# Patient Record
Sex: Female | Born: 1935 | Race: White | Hispanic: No | Smoking: Never smoker
Health system: Southern US, Community
[De-identification: ages and names within clinical notes are randomized; demographics above are authoritative.]

## PROBLEM LIST (undated history)

## (undated) DIAGNOSIS — H9110 Presbycusis, unspecified ear: Secondary | ICD-10-CM

## (undated) DIAGNOSIS — M159 Polyosteoarthritis, unspecified: Secondary | ICD-10-CM

## (undated) DIAGNOSIS — N6019 Diffuse cystic mastopathy of unspecified breast: Secondary | ICD-10-CM

## (undated) DIAGNOSIS — K635 Polyp of colon: Secondary | ICD-10-CM

## (undated) DIAGNOSIS — M8580 Other specified disorders of bone density and structure, unspecified site: Secondary | ICD-10-CM

## (undated) DIAGNOSIS — I493 Ventricular premature depolarization: Secondary | ICD-10-CM

## (undated) DIAGNOSIS — I4892 Unspecified atrial flutter: Principal | ICD-10-CM

## (undated) DIAGNOSIS — I4819 Other persistent atrial fibrillation: Secondary | ICD-10-CM

## (undated) DIAGNOSIS — K5790 Diverticulosis of intestine, part unspecified, without perforation or abscess without bleeding: Secondary | ICD-10-CM

## (undated) DIAGNOSIS — H35372 Puckering of macula, left eye: Secondary | ICD-10-CM

## (undated) DIAGNOSIS — H903 Sensorineural hearing loss, bilateral: Secondary | ICD-10-CM

## (undated) HISTORY — PX: OTHER SURGICAL HISTORY: SHX169

## (undated) HISTORY — DX: Unspecified atrial flutter: I48.92

## (undated) HISTORY — DX: Other persistent atrial fibrillation: I48.19

## (undated) HISTORY — PX: ABDOMINAL HYSTERECTOMY: SHX81

## (undated) HISTORY — PX: APPENDECTOMY: SHX54

## (undated) HISTORY — DX: Diverticulosis of intestine, part unspecified, without perforation or abscess without bleeding: K57.90

## (undated) HISTORY — PX: ROTATOR CUFF REPAIR: SHX139

## (undated) HISTORY — PX: CATARACT EXTRACTION, BILATERAL: SHX1313

## (undated) HISTORY — DX: Polyosteoarthritis, unspecified: M15.9

## (undated) HISTORY — DX: Other specified disorders of bone density and structure, unspecified site: M85.80

## (undated) HISTORY — PX: TONSILLECTOMY: SUR1361

## (undated) HISTORY — DX: Presbycusis, unspecified ear: H91.10

## (undated) HISTORY — DX: Diffuse cystic mastopathy of unspecified breast: N60.19

## (undated) HISTORY — DX: Polyp of colon: K63.5

## (undated) HISTORY — DX: Sensorineural hearing loss, bilateral: H90.3

## (undated) HISTORY — DX: Ventricular premature depolarization: I49.3

## (undated) HISTORY — DX: Puckering of macula, left eye: H35.372

---

## 1997-12-20 ENCOUNTER — Ambulatory Visit (HOSPITAL_COMMUNITY): Admission: RE | Admit: 1997-12-20 | Discharge: 1997-12-20 | Payer: Self-pay | Admitting: Internal Medicine

## 1999-01-01 ENCOUNTER — Encounter: Payer: Self-pay | Admitting: Internal Medicine

## 1999-01-01 ENCOUNTER — Ambulatory Visit (HOSPITAL_COMMUNITY): Admission: RE | Admit: 1999-01-01 | Discharge: 1999-01-01 | Payer: Self-pay | Admitting: Internal Medicine

## 1999-05-28 ENCOUNTER — Ambulatory Visit (HOSPITAL_COMMUNITY): Admission: RE | Admit: 1999-05-28 | Discharge: 1999-05-28 | Payer: Self-pay | Admitting: Sports Medicine

## 1999-05-28 ENCOUNTER — Encounter: Payer: Self-pay | Admitting: Sports Medicine

## 1999-06-01 ENCOUNTER — Encounter: Payer: Self-pay | Admitting: Sports Medicine

## 1999-06-01 ENCOUNTER — Ambulatory Visit (HOSPITAL_COMMUNITY): Admission: RE | Admit: 1999-06-01 | Discharge: 1999-06-01 | Payer: Self-pay | Admitting: Sports Medicine

## 1999-08-01 ENCOUNTER — Encounter: Payer: Self-pay | Admitting: Neurosurgery

## 1999-08-01 ENCOUNTER — Ambulatory Visit (HOSPITAL_COMMUNITY): Admission: RE | Admit: 1999-08-01 | Discharge: 1999-08-01 | Payer: Self-pay | Admitting: Neurosurgery

## 1999-08-13 ENCOUNTER — Encounter: Admission: RE | Admit: 1999-08-13 | Discharge: 1999-09-13 | Payer: Self-pay | Admitting: Neurosurgery

## 2000-01-03 ENCOUNTER — Ambulatory Visit (HOSPITAL_COMMUNITY): Admission: RE | Admit: 2000-01-03 | Discharge: 2000-01-03 | Payer: Self-pay | Admitting: Internal Medicine

## 2001-01-12 ENCOUNTER — Ambulatory Visit (HOSPITAL_COMMUNITY): Admission: RE | Admit: 2001-01-12 | Discharge: 2001-01-12 | Payer: Self-pay | Admitting: Internal Medicine

## 2002-01-21 ENCOUNTER — Encounter: Payer: Self-pay | Admitting: Internal Medicine

## 2002-01-21 ENCOUNTER — Ambulatory Visit (HOSPITAL_COMMUNITY): Admission: RE | Admit: 2002-01-21 | Discharge: 2002-01-21 | Payer: Self-pay | Admitting: Internal Medicine

## 2003-01-31 ENCOUNTER — Ambulatory Visit (HOSPITAL_COMMUNITY): Admission: RE | Admit: 2003-01-31 | Discharge: 2003-01-31 | Payer: Self-pay | Admitting: Internal Medicine

## 2003-01-31 ENCOUNTER — Encounter: Payer: Self-pay | Admitting: Internal Medicine

## 2004-02-01 ENCOUNTER — Ambulatory Visit (HOSPITAL_COMMUNITY): Admission: RE | Admit: 2004-02-01 | Discharge: 2004-02-01 | Payer: Self-pay | Admitting: Family Medicine

## 2005-02-04 ENCOUNTER — Ambulatory Visit (HOSPITAL_COMMUNITY): Admission: RE | Admit: 2005-02-04 | Discharge: 2005-02-04 | Payer: Self-pay | Admitting: Internal Medicine

## 2006-02-10 ENCOUNTER — Ambulatory Visit (HOSPITAL_COMMUNITY): Admission: RE | Admit: 2006-02-10 | Discharge: 2006-02-10 | Payer: Self-pay | Admitting: Internal Medicine

## 2007-02-13 ENCOUNTER — Ambulatory Visit (HOSPITAL_COMMUNITY): Admission: RE | Admit: 2007-02-13 | Discharge: 2007-02-13 | Payer: Self-pay | Admitting: Internal Medicine

## 2008-02-18 ENCOUNTER — Ambulatory Visit (HOSPITAL_COMMUNITY): Admission: RE | Admit: 2008-02-18 | Discharge: 2008-02-18 | Payer: Self-pay | Admitting: Internal Medicine

## 2009-02-20 ENCOUNTER — Ambulatory Visit (HOSPITAL_COMMUNITY): Admission: RE | Admit: 2009-02-20 | Discharge: 2009-02-20 | Payer: Self-pay | Admitting: Internal Medicine

## 2010-02-22 ENCOUNTER — Ambulatory Visit (HOSPITAL_COMMUNITY): Admission: RE | Admit: 2010-02-22 | Discharge: 2010-02-22 | Payer: Self-pay | Admitting: Internal Medicine

## 2011-01-28 ENCOUNTER — Other Ambulatory Visit (HOSPITAL_COMMUNITY): Payer: Self-pay | Admitting: Internal Medicine

## 2011-01-28 DIAGNOSIS — Z1231 Encounter for screening mammogram for malignant neoplasm of breast: Secondary | ICD-10-CM

## 2011-03-01 ENCOUNTER — Ambulatory Visit (HOSPITAL_COMMUNITY)
Admission: RE | Admit: 2011-03-01 | Discharge: 2011-03-01 | Disposition: A | Discharge status: Home / Self Care | Payer: Medicare Other | Source: Ambulatory Visit | Attending: Internal Medicine | Admitting: Internal Medicine

## 2011-03-01 DIAGNOSIS — Z1231 Encounter for screening mammogram for malignant neoplasm of breast: Secondary | ICD-10-CM | POA: Insufficient documentation

## 2011-11-29 ENCOUNTER — Encounter (HOSPITAL_COMMUNITY): Payer: Self-pay | Admitting: Cardiology

## 2011-11-29 ENCOUNTER — Inpatient Hospital Stay (HOSPITAL_COMMUNITY)
Admission: AD | Admit: 2011-11-29 | Discharge: 2011-11-30 | DRG: 310 | Disposition: A | Discharge status: Home / Self Care | Payer: Medicare Other | Source: Ambulatory Visit | Attending: Cardiology | Admitting: Cardiology

## 2011-11-29 ENCOUNTER — Other Ambulatory Visit: Payer: Self-pay | Admitting: Cardiology

## 2011-11-29 ENCOUNTER — Encounter: Payer: Self-pay | Admitting: Cardiology

## 2011-11-29 DIAGNOSIS — Z800 Family history of malignant neoplasm of digestive organs: Secondary | ICD-10-CM

## 2011-11-29 DIAGNOSIS — M47814 Spondylosis without myelopathy or radiculopathy, thoracic region: Secondary | ICD-10-CM | POA: Diagnosis present

## 2011-11-29 DIAGNOSIS — K5730 Diverticulosis of large intestine without perforation or abscess without bleeding: Secondary | ICD-10-CM | POA: Diagnosis present

## 2011-11-29 DIAGNOSIS — Z7982 Long term (current) use of aspirin: Secondary | ICD-10-CM

## 2011-11-29 DIAGNOSIS — R42 Dizziness and giddiness: Secondary | ICD-10-CM | POA: Diagnosis present

## 2011-11-29 DIAGNOSIS — I4891 Unspecified atrial fibrillation: Principal | ICD-10-CM | POA: Diagnosis present

## 2011-11-29 DIAGNOSIS — M949 Disorder of cartilage, unspecified: Secondary | ICD-10-CM | POA: Diagnosis present

## 2011-11-29 DIAGNOSIS — M1990 Unspecified osteoarthritis, unspecified site: Secondary | ICD-10-CM | POA: Diagnosis present

## 2011-11-29 DIAGNOSIS — N6019 Diffuse cystic mastopathy of unspecified breast: Secondary | ICD-10-CM | POA: Diagnosis present

## 2011-11-29 DIAGNOSIS — Z7901 Long term (current) use of anticoagulants: Secondary | ICD-10-CM

## 2011-11-29 DIAGNOSIS — M899 Disorder of bone, unspecified: Secondary | ICD-10-CM | POA: Diagnosis present

## 2011-11-29 DIAGNOSIS — Z8041 Family history of malignant neoplasm of ovary: Secondary | ICD-10-CM

## 2011-11-29 DIAGNOSIS — M1710 Unilateral primary osteoarthritis, unspecified knee: Secondary | ICD-10-CM | POA: Diagnosis present

## 2011-11-29 DIAGNOSIS — H903 Sensorineural hearing loss, bilateral: Secondary | ICD-10-CM | POA: Diagnosis present

## 2011-11-29 DIAGNOSIS — Z8042 Family history of malignant neoplasm of prostate: Secondary | ICD-10-CM

## 2011-11-29 DIAGNOSIS — Z79899 Other long term (current) drug therapy: Secondary | ICD-10-CM

## 2011-11-29 LAB — DIFFERENTIAL
Basophils Absolute: 0.1 10*3/uL (ref 0.0–0.1)
Basophils Relative: 1 % (ref 0–1)
Eosinophils Relative: 1 % (ref 0–5)
Monocytes Absolute: 0.7 10*3/uL (ref 0.1–1.0)
Monocytes Relative: 9 % (ref 3–12)
Neutro Abs: 5.8 10*3/uL (ref 1.7–7.7)

## 2011-11-29 LAB — CBC
MCV: 91.8 fL (ref 78.0–100.0)
Platelets: 172 10*3/uL (ref 150–400)
RBC: 4.61 MIL/uL (ref 3.87–5.11)
RDW: 12.9 % (ref 11.5–15.5)
WBC: 7.2 10*3/uL (ref 4.0–10.5)

## 2011-11-29 LAB — CARDIAC PANEL(CRET KIN+CKTOT+MB+TROPI)
CK, MB: 3 ng/mL (ref 0.3–4.0)
CK, MB: 3 ng/mL (ref 0.3–4.0)
Total CK: 106 U/L (ref 7–177)
Troponin I: <0.3 ng/mL (ref <0.30)

## 2011-11-29 LAB — COMPREHENSIVE METABOLIC PANEL
ALT: 13 U/L (ref 0–35)
AST: 19 U/L (ref 0–37)
Albumin: 3.9 g/dL (ref 3.5–5.2)
Alkaline Phosphatase: 64 U/L (ref 39–117)
CO2: 27 mEq/L (ref 19–32)
Chloride: 107 mEq/L (ref 96–112)
GFR calc non Af Amer: 79 mL/min — ABNORMAL LOW (ref >90)
Potassium: 3.7 mEq/L (ref 3.5–5.1)
Sodium: 143 mEq/L (ref 135–145)
Total Bilirubin: 0.3 mg/dL (ref 0.3–1.2)

## 2011-11-29 LAB — PROTIME-INR: INR: 0.98 (ref 0.00–1.49)

## 2011-11-29 MED ORDER — HEPARIN BOLUS VIA INFUSION
3000.0000 [IU] | Freq: Once | INTRAVENOUS | Status: AC
  Administered 2011-11-29: 3000 [IU] via INTRAVENOUS
  Filled 2011-11-29: qty 3000

## 2011-11-29 MED ORDER — ACETAMINOPHEN 325 MG PO TABS: 650.0000 mg | ORAL_TABLET | ORAL | Status: DC | PRN

## 2011-11-29 MED ORDER — DEXTROSE 5 % IV SOLN
5.0000 mg/h | INTRAVENOUS | Status: DC
  Administered 2011-11-29: 5 mg/h via INTRAVENOUS
  Filled 2011-11-29: qty 100

## 2011-11-29 MED ORDER — HEPARIN (PORCINE) IN NACL 100-0.45 UNIT/ML-% IJ SOLN
1000.0000 [IU]/h | INTRAMUSCULAR | Status: DC
  Administered 2011-11-29 (×2): 1000 [IU]/h via INTRAVENOUS
  Filled 2011-11-29 (×2): qty 250

## 2011-11-29 MED ORDER — ONDANSETRON HCL 4 MG/2ML IJ SOLN: 4.0000 mg | Freq: Four times a day (QID) | INTRAMUSCULAR | Status: DC | PRN

## 2011-11-29 MED ORDER — OFF THE BEAT BOOK
Freq: Once | Status: AC
  Administered 2011-11-29: 16:00:00
  Filled 2011-11-29: qty 1

## 2011-11-29 MED ORDER — NITROGLYCERIN 0.4 MG SL SUBL: 0.4000 mg | SUBLINGUAL_TABLET | SUBLINGUAL | Status: DC | PRN

## 2011-11-29 NOTE — Progress Notes (Signed)
ANTICOAGULATION CONSULT NOTE - Initial Consult  Pharmacy Consult for Heparin Indication: atrial fibrillation  Allergies  Allergen Reactions  . Penicillins Itching and Swelling    Patient Measurements: Height: 5\' 4"  (162.6 cm) Weight: 139 lb 14.4 oz (63.458 kg) IBW/kg (Calculated) : 54.7    Vital Signs: Temp: 97.4 F (36.3 C) (03/01 1500) BP: 140/82 mmHg (03/01 1500) Pulse Rate: 83  (03/01 1500)  Labs: No results found for this basename: HGB:2,HCT:3,PLT:3,APTT:3,LABPROT:3,INR:3,HEPARINUNFRC:3,CREATININE:3,CKTOTAL:3,CKMB:3,TROPONINI:3 in the last 72 hours CrCl is unknown because no creatinine reading has been taken.  Medical History: Past Medical History  Diagnosis Date  . Diverticulosis   . Fibrocystic breast disease   . DJD (degenerative joint disease), multiple sites   . Hemorrhoids   . Osteopenia   . Macular pucker, left eye   . Sensory hearing loss, bilateral   . Presbycusis   . Colon polyp     Medications:  Prescriptions prior to admission  Medication Sig Dispense Refill  . aspirin EC 81 MG tablet Take 81 mg by mouth 3 (three) times a week. Mon, wed, and fri      . b complex vitamins tablet Take 1 tablet by mouth daily.      . Calcium Carbonate-Vitamin D (CALCIUM-VITAMIN D) 500-200 MG-UNIT per tablet Take 1 tablet by mouth daily.       . fish oil-omega-3 fatty acids 1000 MG capsule Take 1,200 mg by mouth daily.      . Multiple Vitamins-Minerals (ICAPS PO) Take 1 tablet by mouth daily.      . naproxen sodium (ANAPROX) 220 MG tablet Take 220 mg by mouth daily as needed. For pain        Assessment: 78 year old female with Afib  Goal of Therapy:  Heparin level 0.3-0.7 units/ml   Plan:  1) Heparin 3000 units iv bolus x 1 2) Heparin drip at  1000 units / hr 3) Heparin level 8 hours after heparin starts 4) Daily heparin level / CBC  Thank you.  Elwin Sleight 11/29/2011,3:27 PM

## 2011-11-29 NOTE — H&P (Signed)
Progress Notes     Patient: [name], [name] Provider: Armanda Magic, MD  DOB: [birth year] Age: 78 Y Sex: Female Date: 11/29/2011  Phone: [phone]   Address: [address], [place], [place]  Pcp: ROBERT N GATES       Subjective:     CC:    1. TT/urgent work in/flutter.        HPI:  General:  The patient presents as an urgent workin from Dr. Kevan Ny for SVT and dizziness. She says she has been having palpitations intermittently for about 1.5 years. They occur several times monthly and usually last about 10-30 minutes and resolve. She gets dizzy and did have 1 episode of syncope with the first episode. This am around 7:30am she developed palpitations and went to Dr. Kevan Ny. She says that her throat felt like it was contricting. She denies any chest pain or pressure. She denies any SOB. She denies any caffeien intake. .        ROS:  See HPI, A twelve system review was perfomed at today's visit. For pertinent positives and negatives see HPI.       Medical History: Diverticulosis, probable diverticulitis July, 2010, Fibrocystic breast disease, DJD of the back, thoracic, knees and neck, Mild hemorrhoids, strong family history of cancer. Sr. with ovarian cancer, brother with prostate cancer, father with colon cancer, Osteopenia, left eye macular pucker - Dr. Mateo Flow, Bilateral sensorineural hearing loss - presbycusis - wears hearing aids, colon polyps, November, 2012, tubular adenoma, Dr. Danise Edge.        Gyn History:        OB History:        Surgical History: TAH and BSO , appendectomy , left rotator cuff repair , left knee injection, Dr. Lequita Halt , Left eye macular surgery, 12/2009 , Bilateral cataract removal and IOL's , colonoscopy .        Family History: Father: deceased Colon Cancer Mother: deceased cancer Brother 1: alive Brother2: deceased cancer Sister 1: alive Sister 2: deceased heart tumor  Family Hx of Colon cancer, ovarian cancer, prostate  cancer, Alzheimer's disease.       Social History:  General:  History of smoking cigarettes: Never smoked.  no Smoking.  no Alcohol.  Caffeine: yes.  no Recreational drug use.  Marital Status: single, widowed.        Medications: Aleve 220 MG Tablet 1 tablet daily p.r.n., Calcium + D 600-200 MG-UNIT Tablet 1 tablet b.i.d., B-Complex Tablet as directed , ICaps Lutein-Zeaxanthin Tablet Extended Release as directed , Fish Oil 1200 MG Capsule 1 capsule qd, Medication List reviewed and reconciled with the patient       Allergies: Penicillin.       Objective:     Vitals: Wt 140, Ht 62.5, BMI 25.20, Pulse sitting 120, BP sitting 150/90.       Examination:  Cardiology, General:  GENERAL APPEARANCE: pleasant, NAD.  HEENT: unremarkable.  CAROTID UPSTROKE: normal, no bruit.  JVD: flat.  HEART SOUNDS: normal S1, S2, no S3 or S4, irregularly irregular, tachycardic.  MURMUR: absent.  LUNGS: no rales or wheezes.  ABDOMEN: soft, non tender, positive bowel sounds, no masses felt.  EXTREMITIES: no leg edema.  PERIPHERAL PULSES: 2 plus bilateral.        Assessment:     Assessment:  1. A-fib - 427.31 (Primary)    Plan:     1. A-fib  She was givin Cardizem 10mg  IV in office with slowing of her heart rate to  102bpm. Shortly after that her heart rate went back to 130-140bpm. She was given IV Lopressor 2.5mg  which transiently brought the heart rate down into the 90's but again went back up to 120-140bpm sustained in atrial fibrillation. Will admit to tele bed and start on IV Cardizem gtt. Her CHADS-VASC score is 2 (age and female) so I will start her on IV Heparin gtt and coumadin per pharmacy. I will get a 2D echo to evalu LA size and LVF.        Immunizations:        Labs:        Procedure Codes: G8553 AT LEAST 1 RX TRANSMIT ERX SYS       Preventive:         Follow Up: admit level 3      Provider: Armanda Magic, MD  Patient: [name], [name] DOB: 01/07/1931 Date:  11/29/2011

## 2011-11-30 ENCOUNTER — Other Ambulatory Visit: Payer: Self-pay

## 2011-11-30 LAB — CARDIAC PANEL(CRET KIN+CKTOT+MB+TROPI)
CK, MB: 2.8 ng/mL (ref 0.3–4.0)
Relative Index: INVALID (ref 0.0–2.5)
Total CK: 86 U/L (ref 7–177)
Troponin I: <0.3 ng/mL (ref <0.30)

## 2011-11-30 MED ORDER — HEPARIN (PORCINE) IN NACL 100-0.45 UNIT/ML-% IJ SOLN
800.0000 [IU]/h | INTRAMUSCULAR | Status: DC
  Filled 2011-11-30: qty 250

## 2011-11-30 MED ORDER — WARFARIN SODIUM 5 MG PO TABS: 5.0000 mg | ORAL_TABLET | Freq: Every day | ORAL | Status: DC

## 2011-11-30 MED ORDER — METOPROLOL SUCCINATE ER 25 MG PO TB24: 25.0000 mg | ORAL_TABLET | Freq: Every day | ORAL | Status: DC

## 2011-11-30 NOTE — Progress Notes (Signed)
ANTICOAGULATION CONSULT NOTE - Follow Up Consult  Pharmacy Consult for heparin Indication: atrial fibrillation  Labs:  Basename 11/30/11 0023 11/29/11 2035 11/29/11 1509 11/29/11 1508  HGB -- -- 14.7 --  HCT -- -- 42.3 --  PLT -- -- 172 --  APTT -- -- 29 --  LABPROT -- -- 13.2 --  INR -- -- 0.98 --  HEPARINUNFRC 1.04* -- -- --  CREATININE -- -- 0.74 --  CKTOTAL -- 107 -- 106  CKMB -- 3.0 -- 3.0  TROPONINI -- <0.30 -- <0.30   Assessment: 78yo female supratherapeutic on heparin with initial dosing for Afib.  Goal of Therapy:  Heparin level 0.3-0.7 units/ml   Plan:  Will hold heparin gtt x70min then resume at lower rate of 800 units/hr and check level 8hr after resumed.  Colleen Can PharmD BCPS 11/30/2011,1:27 AM

## 2011-11-30 NOTE — Progress Notes (Addendum)
D/c instructions given to pt and daughter. No questions voiced at this time. Atrial Fibrillation education given to pt. No signs of any distress noted. Pt walked off unit with family.

## 2011-11-30 NOTE — Discharge Summary (Signed)
Patient ID: AMANDINE COVINO MRN: [id number] DOB/AGE: 05-29-1931 78 y.o.  Admit date: 11/29/2011 Discharge date: 11/30/2011  Primary Discharge Diagnosis: Atrial fibrillation  Secondary Discharge Diagnosis: Dizziness, Diverticulosis, probable diverticulitis July, 2010, Fibrocystic breast disease, DJD of the back, thoracic, knees and neck, Mild hemorrhoids, strong family history of cancer. Sr. with ovarian cancer, brother with prostate cancer, father with colon cancer, Osteopenia, left eye macular pucker - Dr. Mateo Flow, Bilateral sensorineural hearing loss - presbycusis - wears hearing aids, colon polyps, November, 2012, tubular adenoma, Dr. Alyson Locket Course: 79 year old who says she has been having palpitations intermittently for about 1.5 years. They occur several times monthly and usually last about 10-30 minutes and resolve. She gets dizzy and did have 1 episode of syncope with the first episode. On 11/29/11 at around 7:30am she developed palpitations and went to Dr. Kevan Ny. She says that her throat felt like it was contricting. She denies any chest pain or pressure. She denies any SOB. She denies any caffeine intake.   She was admitted. Cardiac markers were negative. At 5am cardizem was stopped because of asymptomatic BP of 90/71. ECG from 730 this am shows afib with good rate control. At around 740, she converted to NSR.   She feels good. No SOB, no CP. Spoke with her and daughter at length about afib, importance of coumadin.   Dr. Mayford Knife sent in a prescription for Toprol 25mg . She will continue.    Discharge Exam: Blood pressure 120/67, pulse 81, temperature 98.3 F (36.8 C), temperature source Oral, resp. rate 20, height 5\' 4"  (1.626 m), weight 63.504 kg (140 lb), SpO2 99.00%.    GEN: AAO x 3 in NAD CV: RRR no m/r/g LUNGS: CTAB ABD: soft NT ND +BS EXT: no edema, normal pulses  Labs:   Lab Results  Component Value Date   WBC 7.2 11/29/2011   HGB 14.7 11/29/2011   HCT  42.3 11/29/2011   MCV 91.8 11/29/2011   PLT 172 11/29/2011     Lab 11/29/11 1509  NA 143  K 3.7  CL 107  CO2 27  BUN 15  CREATININE 0.74  CALCIUM 10.0  PROT 7.1  BILITOT 0.3  ALKPHOS 64  ALT 13  AST 19  GLUCOSE 114*   Lab Results  Component Value Date   CKTOTAL 86 11/30/2011   CKMB 2.8 11/30/2011   TROPONINI <0.30 11/30/2011   EKG: as above, personally viewed  FOLLOW UP PLANS AND APPOINTMENTS Discharge Orders    Future Orders Please Complete By Expires   Diet - low sodium heart healthy      Increase activity slowly        Medication List  As of 11/30/2011  9:52 AM   TAKE these medications         aspirin EC 81 MG tablet   Take 81 mg by mouth 3 (three) times a week. Mon, wed, and fri      b complex vitamins tablet   Take 1 tablet by mouth daily.      calcium-vitamin D 500-200 MG-UNIT per tablet   Take 1 tablet by mouth daily.      fish oil-omega-3 fatty acids 1000 MG capsule   Take 1,200 mg by mouth daily.      ICAPS PO   Take 1 tablet by mouth daily.      metoprolol succinate 25 MG 24 hr tablet   Commonly known as: TOPROL-XL   Take 1 tablet (25 mg total) by  mouth daily.      naproxen sodium 220 MG tablet   Commonly known as: ANAPROX   Take 220 mg by mouth daily as needed. For pain      warfarin 5 MG tablet   Commonly known as: COUMADIN   Take 1 tablet (5 mg total) by mouth daily.           Follow-up Information    Follow up with Quintella Reichert, MD. (Monday, Coumadin clinic with [facility], also needs ECHOCARDIOGRAM. Follow up with Cristopher Peru, NP in one week. )    Contact information:   [address] [place] [address] [phone]         If atrial fibrillation returns, I told her to take an extra metoprolol 25mg  and lay down or sit down and relax. If not gone after one hour or so, call the office. If worrisome symptoms develop, call 911.  BRING ALL MEDICATIONS WITH YOU TO FOLLOW UP APPOINTMENTS  Time spent with  patient to include physician time: 40 min, education, labs, dc summ SignedAnne Fu, Jeni Duling 11/30/2011, 9:52 AM

## 2011-11-30 NOTE — Progress Notes (Signed)
Pt BP 90/71. HR 66, pt asymptomatic. MD on-call paged and notified- Dr. Antoine Poche. Order to turn off Cardizem drip. Order carried out and will continue to monitor. Pt has no complaints at this time.

## 2012-02-28 ENCOUNTER — Other Ambulatory Visit (HOSPITAL_COMMUNITY): Payer: Self-pay | Admitting: Internal Medicine

## 2012-02-28 DIAGNOSIS — Z1231 Encounter for screening mammogram for malignant neoplasm of breast: Secondary | ICD-10-CM

## 2012-03-27 ENCOUNTER — Ambulatory Visit (HOSPITAL_COMMUNITY)
Admission: RE | Admit: 2012-03-27 | Discharge: 2012-03-27 | Disposition: A | Discharge status: Home / Self Care | Payer: Medicare Other | Source: Ambulatory Visit | Attending: Internal Medicine | Admitting: Internal Medicine

## 2012-03-27 DIAGNOSIS — Z1231 Encounter for screening mammogram for malignant neoplasm of breast: Secondary | ICD-10-CM | POA: Insufficient documentation

## 2012-09-17 ENCOUNTER — Encounter: Payer: Self-pay | Admitting: Cardiology

## 2012-09-17 ENCOUNTER — Other Ambulatory Visit: Payer: Self-pay | Admitting: Cardiology

## 2012-09-17 NOTE — H&P (Signed)
Patient: [name] DOB: 07/23/1931 Age: 78 Y Sex: Female Phone: [phone] Primary Insurance: MEDICARE Payer ID: [id number] Address: [address], [place], [place] Pcp: ROBERT N GATES Encounter Date: 09/14/2012 Provider: Issiac Jamar, MD Appointment Facility: [facility]  Subjective: Chief Complaint(s):  6 month follow up & see [name]   HPI:  General The patient presents today for followup of her atrial fibrillation. SHe is doing well. She denies any chest pain, SOB, DOE, LE edema or syncope. She has occasioanl skipped beats but nonsustained arrhythmias." label="" propId="[id number]" catId="[id number]" encId="[id number]" The patient presents today for followup of her atrial fibrillation. SHe is doing well. She denies any chest pain, SOB, DOE, LE edema or syncope. She has occasioanl skipped beats but nonsustained arrhythmias." itemId="[id number]" categoryId="[id number]"The patient presents today for followup of her atrial fibrillation. SHe is doing well. She denies any chest pain, SOB, DOE, LE edema or syncope. She has occasioanl skipped beats but nonsustained arrhythmias..  Current Medication:  Calcium + D 600-200 MG-UNIT Tablet 1 tablet Once a day     B-Complex Tablet 1 tablet daily     ICaps Lutein-Zeaxanthin Tablet Extended Release 1 tablet daily     Fish Oil 1200 MG Capsule 1 capsule qd     Metoprolol Succinate 25 mg 1 tablet once daily     Warfarin Sodium 5 MG Tablet per pharmD 7.5 mg qd except 5 mg T/Th/Sun     Medication List reviewed and reconciled with the patient   Medical History:  diverticulosis     probable diverticulitis July, 2010     fibrocystic breast disease     DJD of the back, thoracic, knees and neck     mild hemorrhoids     strong family history of cancer. Sr. with ovarian cancer, brother with prostate cancer, father with colon cancer     osteopenia     left eye macular pucker - Dr. Kathryn Hecker     bilateral sensorineural hearing loss -  presbycusis - wears hearing aids - [facility] - Jessica Rhodes, audiologist     colon polyps, November, 2012, tubular adenoma, Dr. Martin Johnson     paroxysmal atrial fibrillation   Allergies/Intolerance:  penicillin   Surgical History:  TAH and BSO     appendectomy     left rotator cuff repair     left knee injection, Dr. Aluisio     Left eye macular surgery, 12/2009     Bilateral cataract removal and IOL's     colonoscopy   Hospitalization: Family History:  Father: deceased Colon Cancer     Mother: deceased cancer     Brother 1: alive     Brother2: deceased cancer     Sister 1: alive     Sister 2: deceased heart tumor   Social History: General History of smoking cigarettes: Never smoked.  no Smoking.  no Alcohol.  Caffeine: yes.  no Recreational drug use.  Marital Status: single, widowed.  ROS: See HPI, A twelve system review was perfomed at today's visit. For pertinent positives and negatives see HPI.  Objective: Vitals: Wt 146.4, Wt change 1.4 lb, Ht 62.5, BMI 26.35, Pulse sitting 78, BP sitting 138/74   Past Results: Examination:  Cardiology, General pleasant, NAD" label="GENERAL APPEARANCE:" categoryPropId="[id number]" examid="[id number]"GENERAL APPEARANCE: pleasant, NAD.  unremarkable" label="HEENT:" categoryPropId="12020" examid="[id number]"HEENT: unremarkable.  normal, no bruit" label="CAROTID UPSTROKE:" categoryPropId="12021" examid="[id number]"CAROTID UPSTROKE: normal, no bruit.  flat" label="JVD:" categoryPropId="12022" examid="[id number]"JVD: flat.  regular, normal S1, S2, no S3 or S4" label="HEART SOUNDS:" categoryPropId="12023" examid="[id number]"HEART   SOUNDS: regular, normal S1, S2, no S3 or S4.  absent" label="MURMUR:" categoryPropId="12024" examid="[id number]"MURMUR: absent.  no rales or wheezes" label="LUNGS:" categoryPropId="12025" examid="[id number]"LUNGS: no rales or wheezes.  soft, non tender, positive bowel sounds, no masses felt" label="ABDOMEN:"  categoryPropId="12026" examid="[id number]"ABDOMEN: soft, non tender, positive bowel sounds, no masses felt.  no leg edema" label="EXTREMITIES:" categoryPropId="12027" examid="[id number]"EXTREMITIES: no leg edema.  2 plus bilateral" label="PERIPHERAL PULSES:" categoryPropId="12028" examid="[id number]"PERIPHERAL PULSES: 2 plus bilateral.  Physical Examination:   Assessment: Assessment:  AF (atrial fibrillation) - 427.31 (Primary)     Anticoagulant monitoring - V58.61     Plan: Treatment: AF (atrial fibrillation) Continue Metoprolol Succinate, 25 mg, 1 tablet, once daily Continue Warfarin Sodium Tablet, 5 MG, per pharmD, Orally, 7.5 mg qd except 5 mg T/Th/Sun Imaging:EKG atrial flutter  Harward,Amy 09/14/2012 11:21:44 AM > Terance Pomplun Cannon 09/14/2012 11:29:26 AM >   She is back in atrial flutter. I have recommended since her INR has been therapeutic to plan DCCV - she is in agreement. Anticoagulant monitoring The patient's PT/INR will be checked in clininc today and anticoagulation adjusted as needed and will be reviewed by me. Please refer to coumadin clinic note. Procedures: Immunizations: Diagnostic Imaging: Lab Reports: Preventive Medicine:    Next Appointment:  DCCV (Reason: PAF)     Billing Information: Visit Code:  99214 OV LEVEL 4.   Procedure Codes:  93000 EKG I AND R.     

## 2012-09-18 ENCOUNTER — Encounter (HOSPITAL_COMMUNITY)
Admission: RE | Disposition: A | Discharge status: Home / Self Care | Payer: Self-pay | Source: Ambulatory Visit | Attending: Cardiology

## 2012-09-18 ENCOUNTER — Ambulatory Visit (HOSPITAL_COMMUNITY): Payer: Medicare Other | Admitting: Certified Registered"

## 2012-09-18 ENCOUNTER — Encounter (HOSPITAL_COMMUNITY): Payer: Self-pay

## 2012-09-18 ENCOUNTER — Ambulatory Visit (HOSPITAL_COMMUNITY)
Admission: RE | Admit: 2012-09-18 | Discharge: 2012-09-18 | Disposition: A | Discharge status: Home / Self Care | Payer: Medicare Other | Source: Ambulatory Visit | Attending: Cardiology | Admitting: Cardiology

## 2012-09-18 ENCOUNTER — Encounter (HOSPITAL_COMMUNITY): Payer: Self-pay | Admitting: Certified Registered"

## 2012-09-18 DIAGNOSIS — Z79899 Other long term (current) drug therapy: Secondary | ICD-10-CM | POA: Insufficient documentation

## 2012-09-18 DIAGNOSIS — I4891 Unspecified atrial fibrillation: Secondary | ICD-10-CM

## 2012-09-18 DIAGNOSIS — I4892 Unspecified atrial flutter: Secondary | ICD-10-CM | POA: Insufficient documentation

## 2012-09-18 DIAGNOSIS — Z7901 Long term (current) use of anticoagulants: Secondary | ICD-10-CM | POA: Insufficient documentation

## 2012-09-18 HISTORY — PX: CARDIOVERSION: SHX1299

## 2012-09-18 SURGERY — CARDIOVERSION
Anesthesia: Monitor Anesthesia Care

## 2012-09-18 MED ORDER — PROPOFOL 10 MG/ML IV BOLUS
INTRAVENOUS | Status: DC | PRN
  Administered 2012-09-18: 70 mg via INTRAVENOUS

## 2012-09-18 MED ORDER — WARFARIN SODIUM 7.5 MG PO TABS: ORAL_TABLET | ORAL | Status: DC

## 2012-09-18 MED ORDER — SODIUM CHLORIDE 0.9 % IV SOLN
INTRAVENOUS | Status: DC
  Administered 2012-09-18: 500 mL via INTRAVENOUS

## 2012-09-18 MED ORDER — METOPROLOL TARTRATE 1 MG/ML IV SOLN: 2.5000 mg | Freq: Once | INTRAVENOUS | Status: DC

## 2012-09-18 MED ORDER — SODIUM CHLORIDE 0.9 % IV SOLN
INTRAVENOUS | Status: DC | PRN
  Administered 2012-09-18: 13:00:00 via INTRAVENOUS

## 2012-09-18 MED ORDER — METOPROLOL TARTRATE 1 MG/ML IV SOLN
INTRAVENOUS | Status: AC
  Filled 2012-09-18: qty 5

## 2012-09-18 NOTE — Interval H&P Note (Signed)
History and Physical Interval Note:  09/18/2012 1:08 PM  [name]  has presented today for surgery, with the diagnosis of a fib  The various methods of treatment have been discussed with the patient and family. After consideration of risks, benefits and other options for treatment, the patient has consented to  Procedure(s) (LRB) with comments: CARDIOVERSION (N/A) as a surgical intervention .  The patient's history has been reviewed, patient examined, no change in status, stable for surgery.  I have reviewed the patient's chart and labs.  Questions were answered to the patient's satisfaction.     TURNER,TRACI R

## 2012-09-18 NOTE — Anesthesia Preprocedure Evaluation (Addendum)
Anesthesia Evaluation  Patient identified by MRN, date of birth, ID band Patient awake    Reviewed: Allergy & Precautions, H&P , NPO status , Patient's Chart, lab work & pertinent test results, reviewed documented beta blocker date and time   Airway Mallampati: II      Dental  (+) Teeth Intact and Dental Advisory Given   Pulmonary          Cardiovascular + dysrhythmias Atrial Fibrillation     Neuro/Psych    GI/Hepatic   Endo/Other    Renal/GU      Musculoskeletal   Abdominal   Peds  Hematology   Anesthesia Other Findings   Reproductive/Obstetrics                           Anesthesia Physical Anesthesia Plan  ASA: III  Anesthesia Plan: General   Post-op Pain Management:    Induction: Intravenous  Airway Management Planned: Mask  Additional Equipment:   Intra-op Plan:   Post-operative Plan: Extubation in OR  Informed Consent: I have reviewed the patients History and Physical, chart, labs and discussed the procedure including the risks, benefits and alternatives for the proposed anesthesia with the patient or authorized representative who has indicated his/her understanding and acceptance.   Dental advisory given  Plan Discussed with: CRNA, Anesthesiologist and Surgeon  Anesthesia Plan Comments:         Anesthesia Quick Evaluation

## 2012-09-18 NOTE — OR Nursing (Signed)
R series Pads placed to anterior and posterior chest.  Propofol 70 mg administered by anesthesia and patient cardioverted at 150 joules to Normal Sinus Rhythum at 1333.  Per MD order, Lopressor 2.5mg  IV administered.  Pressure 122/70.  MD aware of pressure.   At 1345 her pressure was 113/64.

## 2012-09-18 NOTE — Preoperative (Signed)
Beta Blockers   Reason not to administer Beta Blockers:Metoprolol at 0800hrs 09/18/2013

## 2012-09-18 NOTE — H&P (View-Only) (Signed)
Patient: [name], [name] DOB: 1931-04-30 Age: 78 Y Sex: Female Phone: [phone] Primary Insurance: MEDICARE Payer ID: [id number] Address: [address] [place], [place], [place] Pcp: Barbette Or Encounter Date: 09/14/2012 Provider: Armanda Magic, MD Appointment Facility: [facility]  Subjective: Chief Complaint(s):  6 month follow up & see Riki Rusk   HPI:  General The patient presents today for followup of her atrial fibrillation. SHe is doing well. She denies any chest pain, SOB, DOE, LE edema or syncope. She has occasioanl skipped beats but nonsustained arrhythmias." label="" propId="22371" catId="[id number]" FAOZH="[id number]" The patient presents today for followup of her atrial fibrillation. SHe is doing well. She denies any chest pain, SOB, DOE, LE edema or syncope. She has occasioanl skipped beats but nonsustained arrhythmias." itemId="[id number]" categoryId="[id number]"The patient presents today for followup of her atrial fibrillation. SHe is doing well. She denies any chest pain, SOB, DOE, LE edema or syncope. She has occasioanl skipped beats but nonsustained arrhythmias..  Current Medication:  Calcium + D 600-200 MG-UNIT Tablet 1 tablet Once a day     B-Complex Tablet 1 tablet daily     ICaps Lutein-Zeaxanthin Tablet Extended Release 1 tablet daily     Fish Oil 1200 MG Capsule 1 capsule qd     Metoprolol Succinate 25 mg 1 tablet once daily     Warfarin Sodium 5 MG Tablet per pharmD 7.5 mg qd except 5 mg T/Th/Sun     Medication List reviewed and reconciled with the patient   Medical History:  diverticulosis     probable diverticulitis July, 2010     fibrocystic breast disease     DJD of the back, thoracic, knees and neck     mild hemorrhoids     strong family history of cancer. Sr. with ovarian cancer, brother with prostate cancer, father with colon cancer     osteopenia     left eye macular pucker - Dr. Mateo Flow     bilateral sensorineural hearing loss -  presbycusis - wears hearing aids - [facility] - Galvin Proffer, audiologist     colon polyps, November, 2012, tubular adenoma, Dr. Danise Edge     paroxysmal atrial fibrillation   Allergies/Intolerance:  penicillin   Surgical History:  TAH and BSO     appendectomy     left rotator cuff repair     left knee injection, Dr. Lequita Halt     Left eye macular surgery, 12/2009     Bilateral cataract removal and IOL's     colonoscopy   Hospitalization: Family History:  Father: deceased Colon Cancer     Mother: deceased cancer     Brother 1: alive     Brother2: deceased cancer     Sister 1: alive     Sister 2: deceased heart tumor   Social History: General History of smoking cigarettes: Never smoked.  no Smoking.  no Alcohol.  Caffeine: yes.  no Recreational drug use.  Marital Status: single, widowed.  ROS: See HPI, A twelve system review was perfomed at today's visit. For pertinent positives and negatives see HPI.  Objective: Vitals: Wt 146.4, Wt change 1.4 lb, Ht 62.5, BMI 26.35, Pulse sitting 78, BP sitting 138/74   Past Results: Examination:  Cardiology, General pleasant, NAD" label="GENERAL APPEARANCE:" categoryPropId="12019" examid="[id number]"GENERAL APPEARANCE: pleasant, NAD.  unremarkable" label="HEENT:" categoryPropId="12020" examid="[id number]"HEENT: unremarkable.  normal, no bruit" label="CAROTID UPSTROKE:" categoryPropId="12021" examid="[id number]"CAROTID UPSTROKE: normal, no bruit.  flat" label="JVD:" categoryPropId="12022" examid="[id number]"JVD: flat.  regular, normal S1, S2, no S3 or S4" label="HEART SOUNDS:" categoryPropId="12023" examid="[id number]"HEART  SOUNDS: regular, normal S1, S2, no S3 or S4.  absent" label="MURMUR:" categoryPropId="12024" examid="[id number]"MURMUR: absent.  no rales or wheezes" label="LUNGS:" categoryPropId="12025" examid="[id number]"LUNGS: no rales or wheezes.  soft, non tender, positive bowel sounds, no masses felt" label="ABDOMEN:"  categoryPropId="12026" examid="[id number]"ABDOMEN: soft, non tender, positive bowel sounds, no masses felt.  no leg edema" label="EXTREMITIES:" categoryPropId="12027" examid="[id number]"EXTREMITIES: no leg edema.  2 plus bilateral" label="PERIPHERAL PULSES:" categoryPropId="12028" examid="[id number]"PERIPHERAL PULSES: 2 plus bilateral.  Physical Examination:   Assessment: Assessment:  AF (atrial fibrillation) - 427.31 (Primary)     Anticoagulant monitoring - V58.61     Plan: Treatment: AF (atrial fibrillation) Continue Metoprolol Succinate, 25 mg, 1 tablet, once daily Continue Warfarin Sodium Tablet, 5 MG, per pharmD, Orally, 7.5 mg qd except 5 mg T/Th/Sun Imaging:EKG atrial flutter  Harward,Amy 09/14/2012 11:21:44 AM > TURNER,TRACI M 09/14/2012 11:29:26 AM >   She is back in atrial flutter. I have recommended since her INR has been therapeutic to plan DCCV - she is in agreement. Anticoagulant monitoring The patient's PT/INR will be checked in clininc today and anticoagulation adjusted as needed and will be reviewed by me. Please refer to coumadin clinic note. Procedures: Immunizations: Diagnostic Imaging: Lab Reports: Preventive Medicine:    Next Appointment:  DCCV (Reason: PAF)     Billing Information: Visit Code:  16109 OV LEVEL 4.   Procedure Codes:  93000 EKG I AND R.

## 2012-09-18 NOTE — Anesthesia Postprocedure Evaluation (Signed)
  Anesthesia Post-op Note  Patient: [name]  Procedure(s) Performed: Procedure(s) (LRB) with comments: CARDIOVERSION (N/A)  Patient Location: Endoscopy Unit  Anesthesia Type:General  Level of Consciousness: awake, alert  and oriented  Airway and Oxygen Therapy: Patient Spontanous Breathing  Post-op Pain: none  Post-op Assessment: Post-op Vital signs reviewed, Patient's Cardiovascular Status Stable, Respiratory Function Stable, Patent Airway and No signs of Nausea or vomiting  Post-op Vital Signs: Reviewed and stable  Complications: No apparent anesthesia complications

## 2012-09-18 NOTE — Transfer of Care (Signed)
Immediate Anesthesia Transfer of Care Note  Patient: [name]  Procedure(s) Performed: Procedure(s) (LRB) with comments: CARDIOVERSION (N/A)  Patient Location: Endoscopy Unit  Anesthesia Type:General  Level of Consciousness: awake, alert  and oriented  Airway & Oxygen Therapy: Patient Spontanous Breathing and Patient connected to nasal cannula oxygen  Post-op Assessment: Report given to PACU RN, Post -op Vital signs reviewed and stable and Patient moving all extremities  Post vital signs: Reviewed and stable  Complications: No apparent anesthesia complications

## 2012-09-18 NOTE — CV Procedure (Signed)
Electrical Cardioversion Procedure Note [name] [id number] June 05, 1931  Procedure: Electrical Cardioversion Indications:  Atrial Flutter  Time Out: Verified patient identification, verified procedure,medications/allergies/relevent history reviewed, required imaging and test results available.  Performed  Procedure Details  The patient was NPO after midnight. Anesthesia was administered at the beside  by Dr.Edwards with 70mg  of propofol.  Cardioversion was done with synchronized biphasic defibrillation with AP pads with 150watts.  The patient converted to normal sinus rhythm. The patient tolerated the procedure well   IMPRESSION:  Successful cardioversion of atrial flutter to NSR    [name] R 09/18/2012, 1:33 PM

## 2012-09-21 ENCOUNTER — Encounter (HOSPITAL_COMMUNITY): Payer: Self-pay | Admitting: Cardiology

## 2012-10-05 ENCOUNTER — Other Ambulatory Visit (HOSPITAL_COMMUNITY): Payer: Self-pay | Admitting: Cardiology

## 2012-10-05 DIAGNOSIS — I4891 Unspecified atrial fibrillation: Secondary | ICD-10-CM

## 2012-10-15 ENCOUNTER — Ambulatory Visit (HOSPITAL_COMMUNITY)
Admission: RE | Admit: 2012-10-15 | Discharge: 2012-10-15 | Disposition: A | Discharge status: Home / Self Care | Payer: Medicare Other | Source: Ambulatory Visit | Attending: Cardiology | Admitting: Cardiology

## 2012-10-15 DIAGNOSIS — Z79899 Other long term (current) drug therapy: Secondary | ICD-10-CM | POA: Insufficient documentation

## 2012-10-15 DIAGNOSIS — I4891 Unspecified atrial fibrillation: Secondary | ICD-10-CM | POA: Insufficient documentation

## 2012-12-31 ENCOUNTER — Other Ambulatory Visit: Payer: Self-pay | Admitting: Cardiology

## 2013-01-04 ENCOUNTER — Other Ambulatory Visit: Payer: Self-pay | Admitting: Cardiology

## 2013-01-04 NOTE — H&P (Signed)
Office Visit     Patient: [name], [name] Account Number: [id number] Provider: Traci Turner, MD  DOB: 03/10/1931 Age: 79 Y Sex: Female Date: 12/30/2012  Phone: [phone]   Address: [address], [place], [place]  Pcp: ROBERT N GATES          1. FOLLOW UP/SEE LINDA.        HPI:  General:  Mrs Mells is a 79 yo female with hx of palpitations and Atrial fibrillation. 2013 nuclear stress test without ischemia. On 09/18/12 after appropriate anticoagulation, she had successful cardioversion and returned on 10/01/12 post procedure for followup and was noted to be back in Atrial flutter. with HR low 100s and was started on Amiodarone 200mg daily. She is feeling better after going on the Amiodarone and went back in NSR. She was seen again a few weeks ago and she was back in afib with a low amio level so she was reloaded with amio and now presents for followup. SHe denies any chest pain, LE edema, palpitaitons or syncope. She has noticed some mild DOE when walking..        ROS:  See HPI, A twelve system review was perfomed at today's visit. For pertinent positives and negatives see HPI.       Medical History: Diverticulosis, probable diverticulitis July, 2010, Fibrocystic breast disease, DJD of the back, thoracic, knees and neck, Mild hemorrhoids, strong family history of cancer. Sr. with ovarian cancer, brother with prostate cancer, father with colon cancer, Osteopenia, left eye macular pucker - Dr. Kathryn Hecker, bilateral sensorineural hearing loss - presbycusis - wears hearing aids - [facility] - Jessica Rhodes, audiologist, colon polyps, November, 2012, tubular adenoma, Dr. Martin Johnson, Paroxysmal atrial fibrillation, Systemic anticoagulation.        Surgical History: TAH and BSO , appendectomy , left rotator cuff repair , left knee injection, Dr. Aluisio , Left eye macular surgery, 12/2009 , Bilateral cataract removal and IOL's , colonoscopy .        Family  History: Father: deceased, Colon CancerMother: deceased, cancerBrother 1: aliveBrother2: deceased, cancerSister 1: aliveSister 2: deceased, heart tumor       Social History:  General:  History of smoking cigarettes: Never smoked.  no Smoking.  no Alcohol.  Caffeine: yes.  no Recreational drug use.  Marital Status: single, widowed.        Medications: Taking Calcium + D 600-200 MG-UNIT Tablet 1 tablet Once a day, Taking B-Complex Tablet 1 tablet daily, Taking ICaps Lutein-Zeaxanthin Tablet Extended Release 1 tablet daily, Taking Fish Oil 1200 MG Capsule 1 capsule qd, Taking Metoprolol Succinate 25 Milligram Miscellaneous Unspecified TAKE 1 TABLET BY MOUTH EVERY DAY , Taking Amiodarone HCl 200 MG Tablet 1 tablet Once a day, Taking Xarelto 15 mg 15 MG Tablet 1 tablet once a day, Medication List reviewed and reconciled with the patient       Allergies: Penicillin.           Vitals: Wt 141.6, Wt change -.4 lb, Ht 62.5, BMI 25.48, Pulse sitting 76, BP sitting 140/92.       Examination:  Cardiology, General:  GENERAL APPEARANCE: pleasant, NAD.  HEENT: unremarkable.  CAROTID UPSTROKE: normal, no bruit.  JVD: flat.  HEART SOUNDS: regular, normal S1, S2, no S3 or S4.  MURMUR: absent.  LUNGS: no rales or wheezes.  ABDOMEN: soft, non tender, positive bowel sounds, no masses felt.  EXTREMITIES: no leg edema.  PERIPHERAL PULSES: 2 plus bilateral.              Assessment:  1. AF (atrial fibrillation) - 427.31 (Primary)  2. Anticoagulant monitoring - V58.61  3. Admission for long-term (current) use of medications - V58.69  4. Elevated blood pressure reading without diagnosis of hypertension - 796.2        1. AF (atrial fibrillation)  Continue Metoprolol Succinate Miscellaneous Unspecified, 25 Milligram, TAKE 1 TABLET BY MOUTH EVERY DAY ; Continue Amiodarone HCl Tablet, 200 MG, 1 tablet, Orally, Once a day ; Continue Xarelto 15 mg Tablet, 15 MG, 1 tablet, orally, once a day .  LAB:  Basic Metabolic LAB: PT (Prothrombin Time) (005199) LAB: CBC with Diff Imaging: EKG atrial fibrillation     Corson,Danielle 12/30/2012 11:13:08 AM > TURNER,TRACI Cannon 12/30/2012 11:20:06 AM >       Procedures:  Venipuncture:  Venipuncture: Smith,Michele 12/30/2012 02:57:19 PM > , performed in right arm.           Labs:    Lab: Basic Metabolic Stable  GLUCOSE 97  70-99 - mg/dL  BUN 18  6-26 - mg/dL  CREATININE 1.10  0.60-1.30 - mg/dl  eGFR (NON-AFRICAN AMERICAN) 48 L >60 - calc  eGFR (AFRICAN AMERICAN) 58 L >60 - calc  SODIUM 142  136-145 - mmol/L  POTASSIUM 4.7  3.5-5.5 - mmol/L  CHLORIDE 104  98-107 - mmol/L  C02 32  22-32 - mmol/L  ANION GAP 10.7  6.0-20.0 - mmol/L  CALCIUM 10.4 H 8.6-10.3 - mg/dL   TURNER,TRACI Cannon 12/30/2012 02:44:00 PM > forward to primary MD Harward,Amy 12/30/2012 03:17:37 PM > for CV, linda will fax to cath lab. To Dr. Gates. GATES,ROBERT N 12/30/2012 06:29:20 PM > aware        Lab: PT (Prothrombin Time) (005199) Abnormal  Prothrombin Time 12.6 H 9.1-12.0 - SEC  INR 1.2  0.8-1.2 -    TURNER,TRACI Cannon 12/31/2012 09:20:22 AM > pt on xarelto Corson,Danielle 12/31/2012 11:26:26 AM > For cardioversion        Lab: CBC with Diff Stable  WBC 6.5  4.0-11.0 - K/ul  RBC 4.83  4.20-5.40 - Cannon/uL  HGB 14.8  12.0-16.0 - g/dL  HCT 45.6  37.0-47.0 - %  MCH 30.5  27.0-33.0 - pg  MPV 10.8 H 7.5-10.7 - fL  MCV 94.4  81.0-99.0 - fL  MCHC 32.4  32.0-36.0 - g/dL  RDW 14.2  11.5-15.5 - %  NRBC# 0.00  -   PLT 164  150-400 - K/uL  NEUT % 74.8 H 43.3-71.9 - %  NRBC% 0.00  - %  LYMPH% 11.5 L 16.8-43.5 - %  MONO % 10.5  4.6-12.4 - %  EOS % 2.6  0.0-7.8 - %  BASO % 0.6  0.0-1.0 - %  NEUT # 4.9  1.9-7.2 - K/uL  LYMPH# 0.80 L 1.10-2.70 - K/uL  MONO # 0.7  0.3-0.8 - K/uL  EOS # 0.2  0.0-0.6 - K/uL  BASO # 0.0  0.0-0.1 - K/uL   TURNER,TRACI Cannon 12/30/2012 02:44:22 PM > Harward,Amy 12/30/2012 03:17:22 PM > for CV, linda will fax to cath lab          Procedure Codes: 93000  EKG I AND R, 80048 ECL BMP, 85025 ECL CBC PLATELET DIFF, 36415 BLOOD COLLECTION ROUTINE VENIPUNCTURE       Follow Up: DCCV         Provider: Traci Turner, MD  Patient: [place], Genifer Cannon DOB: 08/23/1931 Date: 12/30/2012       

## 2013-01-05 ENCOUNTER — Encounter (HOSPITAL_COMMUNITY): Payer: Self-pay

## 2013-01-05 ENCOUNTER — Ambulatory Visit (HOSPITAL_COMMUNITY): Payer: Medicare Other | Admitting: *Deleted

## 2013-01-05 ENCOUNTER — Encounter (HOSPITAL_COMMUNITY)
Admission: RE | Disposition: A | Discharge status: Home / Self Care | Payer: Self-pay | Source: Ambulatory Visit | Attending: Cardiology

## 2013-01-05 ENCOUNTER — Encounter (HOSPITAL_COMMUNITY): Payer: Self-pay | Admitting: *Deleted

## 2013-01-05 ENCOUNTER — Ambulatory Visit (HOSPITAL_COMMUNITY)
Admission: RE | Admit: 2013-01-05 | Discharge: 2013-01-05 | Disposition: A | Discharge status: Home / Self Care | Payer: Medicare Other | Source: Ambulatory Visit | Attending: Cardiology | Admitting: Cardiology

## 2013-01-05 DIAGNOSIS — Z880 Allergy status to penicillin: Secondary | ICD-10-CM | POA: Insufficient documentation

## 2013-01-05 DIAGNOSIS — Z79899 Other long term (current) drug therapy: Secondary | ICD-10-CM | POA: Insufficient documentation

## 2013-01-05 DIAGNOSIS — Z7901 Long term (current) use of anticoagulants: Secondary | ICD-10-CM | POA: Insufficient documentation

## 2013-01-05 DIAGNOSIS — I4892 Unspecified atrial flutter: Secondary | ICD-10-CM | POA: Insufficient documentation

## 2013-01-05 DIAGNOSIS — M899 Disorder of bone, unspecified: Secondary | ICD-10-CM | POA: Insufficient documentation

## 2013-01-05 DIAGNOSIS — M1990 Unspecified osteoarthritis, unspecified site: Secondary | ICD-10-CM | POA: Insufficient documentation

## 2013-01-05 DIAGNOSIS — K261 Acute duodenal ulcer with perforation: Secondary | ICD-10-CM | POA: Insufficient documentation

## 2013-01-05 DIAGNOSIS — I4891 Unspecified atrial fibrillation: Secondary | ICD-10-CM | POA: Insufficient documentation

## 2013-01-05 DIAGNOSIS — R030 Elevated blood-pressure reading, without diagnosis of hypertension: Secondary | ICD-10-CM | POA: Insufficient documentation

## 2013-01-05 HISTORY — PX: CARDIOVERSION: SHX1299

## 2013-01-05 SURGERY — CARDIOVERSION
Anesthesia: Monitor Anesthesia Care

## 2013-01-05 MED ORDER — SODIUM CHLORIDE 0.9 % IV SOLN
INTRAVENOUS | Status: DC
  Administered 2013-01-05: 500 mL via INTRAVENOUS

## 2013-01-05 MED ORDER — PROPOFOL 10 MG/ML IV BOLUS
INTRAVENOUS | Status: DC | PRN
  Administered 2013-01-05: 75 mg via INTRAVENOUS

## 2013-01-05 MED ORDER — LIDOCAINE HCL (CARDIAC) 20 MG/ML IV SOLN
INTRAVENOUS | Status: DC | PRN
  Administered 2013-01-05: 50 mg via INTRAVENOUS

## 2013-01-05 NOTE — CV Procedure (Signed)
Electrical Cardioversion Procedure Note TECORA EUSTACHE [id number] [birth year]  Procedure: Electrical Cardioversion Indications:  Atrial Fibrillation  Time Out: Verified patient identification, verified procedure,medications/allergies/relevent history reviewed, required imaging and test results available.  Performed  Procedure Details  The patient was NPO after midnight. Anesthesia was administered at the beside  by [facility] with 75mg  of propofol.  Cardioversion was done with synchronized biphasic defibrillation with AP pads with 150watts.  The patient converted to normal sinus rhythm. The patient tolerated the procedure well   IMPRESSION:  Successful cardioversion of atrial fibrillation    Juliahna Wiswell R 01/05/2013, 1:37 PM

## 2013-01-05 NOTE — Transfer of Care (Signed)
Immediate Anesthesia Transfer of Care Note  Patient: [name]  Procedure(s) Performed: Procedure(s): CARDIOVERSION (N/A)  Patient Location: Endoscopy Unit  Anesthesia Type:General  Level of Consciousness: awake, alert , oriented and patient cooperative  Airway & Oxygen Therapy: Patient Spontanous Breathing and Patient connected to nasal cannula oxygen  Post-op Assessment: Report given to PACU RN, Post -op Vital signs reviewed and stable and Patient moving all extremities  Post vital signs: Reviewed and stable  Complications: No apparent anesthesia complications

## 2013-01-05 NOTE — Interval H&P Note (Signed)
History and Physical Interval Note:  01/05/2013 1:36 PM  [name]  has presented today for surgery, with the diagnosis of a-fib  The various methods of treatment have been discussed with the patient and family. After consideration of risks, benefits and other options for treatment, the patient has consented to  Procedure(s): CARDIOVERSION (N/A) as a surgical intervention .  The patient's history has been reviewed, patient examined, no change in status, stable for surgery.  I have reviewed the patient's chart and labs.  Questions were answered to the patient's satisfaction.     TURNER,TRACI R

## 2013-01-05 NOTE — Preoperative (Signed)
Beta Blockers   Reason not to administer Beta Blockers:Not Applicable 

## 2013-01-05 NOTE — Anesthesia Preprocedure Evaluation (Addendum)
Anesthesia Evaluation  Patient identified by MRN, date of birth, ID band Patient awake    Reviewed: Allergy & Precautions, H&P , NPO status , Patient's Chart, lab work & pertinent test results, reviewed documented beta blocker date and time   Airway Mallampati: II TM Distance: >3 FB Neck ROM: Full    Dental  (+) Teeth Intact and Dental Advisory Given   Pulmonary  breath sounds clear to auscultation        Cardiovascular + dysrhythmias Rhythm:Irregular Rate:Normal     Neuro/Psych    GI/Hepatic   Endo/Other    Renal/GU      Musculoskeletal   Abdominal   Peds  Hematology   Anesthesia Other Findings   Reproductive/Obstetrics                          Anesthesia Physical Anesthesia Plan  ASA: III  Anesthesia Plan: MAC   Post-op Pain Management:    Induction: Intravenous  Airway Management Planned: Mask  Additional Equipment:   Intra-op Plan:   Post-operative Plan:   Informed Consent: I have reviewed the patients History and Physical, chart, labs and discussed the procedure including the risks, benefits and alternatives for the proposed anesthesia with the patient or authorized representative who has indicated his/her understanding and acceptance.   Dental advisory given  Plan Discussed with: CRNA, Anesthesiologist and Surgeon  Anesthesia Plan Comments:         Anesthesia Quick Evaluation

## 2013-01-05 NOTE — Anesthesia Postprocedure Evaluation (Signed)
  Anesthesia Post-op Note  Patient: [name]  Procedure(s) Performed: Procedure(s): CARDIOVERSION (N/A)  Patient Location: Endoscopy Unit  Anesthesia Type:General  Level of Consciousness: awake, alert , oriented and patient cooperative  Airway and Oxygen Therapy: Patient Spontanous Breathing and Patient connected to nasal cannula oxygen  Post-op Pain: none  Post-op Assessment: Post-op Vital signs reviewed, Patient's Cardiovascular Status Stable, Respiratory Function Stable, Patent Airway and No signs of Nausea or vomiting  Post-op Vital Signs: Reviewed and stable  Complications: No apparent anesthesia complications

## 2013-01-05 NOTE — H&P (View-Only) (Signed)
Office Visit     Patient: [name], [name] Account Number: [id number] Provider: Armanda Magic, MD  DOB: September 18, 1931 Age: 79 Y Sex: Female Date: 12/30/2012  Phone: [phone]   Address: [address] [place], [place], [place]  Pcp: ROBERT N GATES          1. FOLLOW UP/SEE LINDA.        HPI:  General:  [name] is a 79 yo female with hx of palpitations and Atrial fibrillation. 2013 nuclear stress test without ischemia. On 09/18/12 after appropriate anticoagulation, she had successful cardioversion and returned on 10/01/12 post procedure for followup and was noted to be back in Atrial flutter. with HR low 100s and was started on Amiodarone 200mg  daily. She is feeling better after going on the Amiodarone and went back in NSR. She was seen again a few weeks ago and she was back in afib with a low amio level so she was reloaded with amio and now presents for followup. SHe denies any chest pain, LE edema, palpitaitons or syncope. She has noticed some mild DOE when walking..        ROS:  See HPI, A twelve system review was perfomed at today's visit. For pertinent positives and negatives see HPI.       Medical History: Diverticulosis, probable diverticulitis July, 2010, Fibrocystic breast disease, DJD of the back, thoracic, knees and neck, Mild hemorrhoids, strong family history of cancer. Sr. with ovarian cancer, brother with prostate cancer, father with colon cancer, Osteopenia, left eye macular pucker - Dr. Mateo Flow, bilateral sensorineural hearing loss - presbycusis - wears hearing aids - [facility] - Galvin Proffer, audiologist, colon polyps, November, 2012, tubular adenoma, Dr. Danise Edge, Paroxysmal atrial fibrillation, Systemic anticoagulation.        Surgical History: TAH and BSO , appendectomy , left rotator cuff repair , left knee injection, Dr. Lequita Halt , Left eye macular surgery, 12/2009 , Bilateral cataract removal and IOL's , colonoscopy .        Family  History: Father: deceased, Colon CancerMother: deceased, cancerBrother 1: aliveBrother2: deceased, cancerSister 1: aliveSister 2: deceased, heart tumor       Social History:  General:  History of smoking cigarettes: Never smoked.  no Smoking.  no Alcohol.  Caffeine: yes.  no Recreational drug use.  Marital Status: single, widowed.        Medications: Taking Calcium + D 600-200 MG-UNIT Tablet 1 tablet Once a day, Taking B-Complex Tablet 1 tablet daily, Taking ICaps Lutein-Zeaxanthin Tablet Extended Release 1 tablet daily, Taking Fish Oil 1200 MG Capsule 1 capsule qd, Taking Metoprolol Succinate 25 Milligram Miscellaneous Unspecified TAKE 1 TABLET BY MOUTH EVERY DAY , Taking Amiodarone HCl 200 MG Tablet 1 tablet Once a day, Taking Xarelto 15 mg 15 MG Tablet 1 tablet once a day, Medication List reviewed and reconciled with the patient       Allergies: Penicillin.           Vitals: Wt 141.6, Wt change -.4 lb, Ht 62.5, BMI 25.48, Pulse sitting 76, BP sitting 140/92.       Examination:  Cardiology, General:  GENERAL APPEARANCE: pleasant, NAD.  HEENT: unremarkable.  CAROTID UPSTROKE: normal, no bruit.  JVD: flat.  HEART SOUNDS: regular, normal S1, S2, no S3 or S4.  MURMUR: absent.  LUNGS: no rales or wheezes.  ABDOMEN: soft, non tender, positive bowel sounds, no masses felt.  EXTREMITIES: no leg edema.  PERIPHERAL PULSES: 2 plus bilateral.  Assessment:  1. AF (atrial fibrillation) - 427.31 (Primary)  2. Anticoagulant monitoring - V58.61  3. Admission for long-term (current) use of medications - V58.69  4. Elevated blood pressure reading without diagnosis of hypertension - 796.2        1. AF (atrial fibrillation)  Continue Metoprolol Succinate Miscellaneous Unspecified, 25 Milligram, TAKE 1 TABLET BY MOUTH EVERY DAY ; Continue Amiodarone HCl Tablet, 200 MG, 1 tablet, Orally, Once a day ; Continue Xarelto 15 mg Tablet, 15 MG, 1 tablet, orally, once a day .  LAB:  Basic Metabolic LAB: PT (Prothrombin Time) (161096) LAB: CBC with Diff Imaging: EKG atrial fibrillation     Corson,Danielle 12/30/2012 11:13:08 AM > Xavius Spadafore M 12/30/2012 11:20:06 AM >       Procedures:  Venipuncture:  Venipuncture: Smith,Michele 12/30/2012 02:57:19 PM > , performed in right arm.           Labs:    Lab: Basic Metabolic Stable  GLUCOSE 97  70-99 - mg/dL  BUN 18  0-45 - mg/dL  CREATININE 4.09  8.11-9.14 - mg/dl  eGFR (NON-AFRICAN AMERICAN) 48 L >60 - calc  eGFR (AFRICAN AMERICAN) 58 L >60 - calc  SODIUM 142  136-145 - mmol/L  POTASSIUM 4.7  3.5-5.5 - mmol/L  CHLORIDE 104  98-107 - mmol/L  C02 32  22-32 - mmol/L  ANION GAP 10.7  6.0-20.0 - mmol/L  CALCIUM 10.4 H 8.6-10.3 - mg/dL   Santonio Speakman M 78/29/5621 02:44:00 PM > forward to primary MD Harward,Amy 12/30/2012 03:17:37 PM > for CV, linda will fax to cath lab. To Dr. Kevan Ny. GATES,ROBERT N 12/30/2012 06:29:20 PM > aware        Lab: PT (Prothrombin Time) (308657) Abnormal  Prothrombin Time 12.6 H 9.1-12.0 - SEC  INR 1.2  0.8-1.2 -    Johnathen Testa M 12/31/2012 09:20:22 AM > pt on xarelto Corson,Danielle 12/31/2012 11:26:26 AM > For cardioversion        Lab: CBC with Diff Stable  WBC 6.5  4.0-11.0 - K/ul  RBC 4.83  4.20-5.40 - M/uL  HGB 14.8  12.0-16.0 - g/dL  HCT 84.6  96.2-95.2 - %  MCH 30.5  27.0-33.0 - pg  MPV 10.8 H 7.5-10.7 - fL  MCV 94.4  81.0-99.0 - fL  MCHC 32.4  32.0-36.0 - g/dL  RDW 84.1  32.4-40.1 - %  NRBC# 0.00  -   PLT 164  150-400 - K/uL  NEUT % 74.8 H 43.3-71.9 - %  NRBC% 0.00  - %  LYMPH% 11.5 L 16.8-43.5 - %  MONO % 10.5  4.6-12.4 - %  EOS % 2.6  0.0-7.8 - %  BASO % 0.6  0.0-1.0 - %  NEUT # 4.9  1.9-7.2 - K/uL  LYMPH# 0.80 L 1.10-2.70 - K/uL  MONO # 0.7  0.3-0.8 - K/uL  EOS # 0.2  0.0-0.6 - K/uL  BASO # 0.0  0.0-0.1 - K/uL   Arden Axon M 12/30/2012 02:44:22 PM > Harward,Amy 12/30/2012 03:17:22 PM > for CV, linda will fax to cath lab          Procedure Codes: 02725  EKG I AND R, 80048 ECL BMP, 85025 ECL CBC PLATELET DIFF, 36644 BLOOD COLLECTION ROUTINE VENIPUNCTURE       Follow Up: DCCV         Provider: Armanda Magic, MD  Patient: [name], [name] DOB: [birth year] Date: 12/30/2012

## 2013-01-05 NOTE — Anesthesia Procedure Notes (Signed)
Date/Time: 01/05/2013 1:06 PM Performed by: Brien Mates DOBSON Oxygen Delivery Method: Ambu bag Preoxygenation: Pre-oxygenation with 100% oxygen Intubation Type: IV induction Ventilation: Mask ventilation without difficulty

## 2013-01-06 ENCOUNTER — Encounter (HOSPITAL_COMMUNITY): Payer: Self-pay | Admitting: Cardiology

## 2013-03-01 ENCOUNTER — Other Ambulatory Visit (HOSPITAL_COMMUNITY): Payer: Self-pay | Admitting: Internal Medicine

## 2013-03-01 DIAGNOSIS — Z1231 Encounter for screening mammogram for malignant neoplasm of breast: Secondary | ICD-10-CM

## 2013-03-29 ENCOUNTER — Ambulatory Visit (HOSPITAL_COMMUNITY)
Admission: RE | Admit: 2013-03-29 | Discharge: 2013-03-29 | Disposition: A | Discharge status: Home / Self Care | Payer: Medicare Other | Source: Ambulatory Visit | Attending: Internal Medicine | Admitting: Internal Medicine

## 2013-03-29 DIAGNOSIS — Z1231 Encounter for screening mammogram for malignant neoplasm of breast: Secondary | ICD-10-CM | POA: Insufficient documentation

## 2013-06-07 ENCOUNTER — Encounter: Payer: Self-pay | Admitting: Internal Medicine

## 2013-06-07 ENCOUNTER — Encounter: Payer: Self-pay | Admitting: *Deleted

## 2013-06-07 ENCOUNTER — Ambulatory Visit (INDEPENDENT_AMBULATORY_CARE_PROVIDER_SITE_OTHER): Payer: Medicare Other | Admitting: Internal Medicine

## 2013-06-07 VITALS — BP 130/80 | HR 100 | Ht 63.5 in | Wt 138.0 lb

## 2013-06-07 DIAGNOSIS — I4892 Unspecified atrial flutter: Secondary | ICD-10-CM

## 2013-06-07 DIAGNOSIS — I4891 Unspecified atrial fibrillation: Secondary | ICD-10-CM

## 2013-06-07 NOTE — Patient Instructions (Addendum)
Your physician has recommended that you have an ablation. Catheter ablation is a medical procedure used to treat some cardiac arrhythmias (irregular heartbeats). During catheter ablation, a long, thin, flexible tube is put into a blood vessel in your groin (upper thigh), or neck. This tube is called an ablation catheter. It is then guided to your heart through the blood vessel. Radio frequency waves destroy small areas of heart tissue where abnormal heartbeats may cause an arrhythmia to start. Please see the instruction sheet given to you today.    Your physician has requested that you have an echocardiogram. Echocardiography is a painless test that uses sound waves to create images of your heart. It provides your doctor with information about the size and shape of your heart and how well your heart's chambers and valves are working. This procedure takes approximately one hour. There are no restrictions for this procedure.  Your physician recommends that you return for lab work today: BMP

## 2013-06-07 NOTE — Progress Notes (Signed)
 Primary Care Physician: Dr Gates Referring Physician:  Dr Turner   [name] is a 79 y.o. female with a h/o persistent atrial fibrillation and atrial flutter who presents for EP consultation.  She reports initially being diagnosed with atrial fibrillation 2 years ago.  She presented to Dr Gates for routine physical and was found to have afib with V rates 120s.  She was admitted overnight observation.  She converted to sinus rhythm.  She has had several visits during which she was in afib since.  In general, she is active.  She volunteers frequently.  She does have fatigue with her atrial arrhythmias.  She presented 12/13 with atrial flutter and underwent cardioversion.  She returned to atrial fibrillation and was placed on amiodarone.  She was again cardioverted 4/14.  She does not recall significant improvement in symptoms with sinus rhythm.  She returns today with atrial flutter despite medical therapy with amiodarone. Today, she denies symptoms of palpitations, chest pain, shortness of breath, orthopnea, PND,   dizziness, presyncope, syncope, or neurologic sequela.  + swelling at night. The patient is tolerating medications without difficulties and is otherwise without complaint today.   Past Medical History  Diagnosis Date  . Diverticulosis   . Fibrocystic breast disease   . DJD (degenerative joint disease), multiple sites   . Osteopenia   . Macular pucker, left eye   . Sensory hearing loss, bilateral   . Presbycusis   . Colon polyp   . Persistent atrial fibrillation     s/p prior cardioversion  . Atrial flutter    Past Surgical History  Procedure Laterality Date  . Abdominal hysterectomy      with BSO  . Appendectomy    . Rotator cuff repair    . Eye srugery    . Cataract extraction, bilateral    . Cardioversion  09/18/2012    Procedure: CARDIOVERSION;  Surgeon: Traci R Turner, MD;  Location: [facility] ENDOS[facility];  Service: Cardiovascular;  Laterality: N/A;  . Cardioversion  N/A 01/05/2013    Procedure: CARDIOVERSION;  Surgeon: Traci R Turner, MD;  Location: [facility] ENDOSCOPY;  Service: Cardiovascular;  Laterality: N/A;    Current Outpatient Prescriptions  Medication Sig Dispense Refill  . amiodarone (PACERONE) 200 MG tablet Take 200 mg by mouth daily.      . b complex vitamins tablet Take 1 tablet by mouth daily.      . Calcium Carbonate-Vitamin D (CALCIUM-VITAMIN D) 500-200 MG-UNIT per tablet Take 1 tablet by mouth daily.       . metoprolol succinate (TOPROL-XL) 25 MG 24 hr tablet Take 50 mg by mouth daily.      . Multiple Vitamins-Minerals (ICAPS PO) Take 1 tablet by mouth daily.      . Rivaroxaban (XARELTO) 15 MG TABS tablet Take 15 mg by mouth daily.       No current facility-administered medications for this visit.    Allergies  Allergen Reactions  . Penicillins Itching and Swelling    History   Social History  . Marital Status: Widowed    Spouse Name: N/A    Number of Children: N/A  . Years of Education: N/A   Occupational History  . Not on file.   Social History Main Topics  . Smoking status: Never Smoker   . Smokeless tobacco: Not on file  . Alcohol Use: No  . Drug Use: No  . Sexual Activity:    Other Topics Concern  . Not on file   Social   History Narrative   Lives in [place] alone.  Retired.    Family History  Problem Relation Age of Onset  . Cancer Father   . Cancer Mother   . Cancer Brother   . Cancer Sister     ROS- All systems are reviewed and negative except as per the HPI above  Physical Exam: Filed Vitals:   06/07/13 1525  BP: 130/80  Pulse: 100  Height: 5' 3.5" (1.613 m)  Weight: 138 lb (62.596 kg)    GEN- The patient is elderly but pleasant appearing, alert and oriented x 3 today.   Head- normocephalic, atraumatic Eyes-  Sclera clear, conjunctiva pink Ears- hearing intact Oropharynx- clear Neck- supple, no JVP Lymph- no cervical lymphadenopathy Lungs- Clear to ausculation bilaterally, normal work of  breathing Heart- irregular rate and rhythm, no murmurs, rubs or gallops, PMI not laterally displaced GI- soft, NT, ND, + BS Extremities- no clubbing, cyanosis, or edema MS- no significant deformity or atrophy Skin- no rash or lesion Psych- euthymic mood, full affect Neuro- strength and sensation are intact  EKG today reveals typical appearing atrial flutter Dr Turners office notes and epic records are reviewed today  Assessment and Plan:  The patient has symptomatic atrial fibrillation and typical appearing atrial flutter.  She has failed medical therapy with amiodarone.  Therapeutic strategies for afib and atrial flutter including medicine and ablation were discussed in detail with the patient today. Risk, benefits, and alternatives to EP study and radiofrequency ablation for afib were also discussed in detail today. These risks include but are not limited to stroke, bleeding, vascular damage, tamponade, perforation, damage to the esophagus, lungs, and other structures, pulmonary vein stenosis, worsening renal function, and death. The patient understands these risk and wishes to proceed.  We will therefore proceed with catheter ablation at the next available time. We will obtain a 2D echo to evaluate for structural changes in the interim. She will require a TEE prior to her ablation.    

## 2013-06-08 LAB — BASIC METABOLIC PANEL
CO2: 30 mEq/L (ref 19–32)
Calcium: 9.5 mg/dL (ref 8.4–10.5)
Creatinine, Ser: 1 mg/dL (ref 0.4–1.2)
Sodium: 141 mEq/L (ref 135–145)

## 2013-06-15 ENCOUNTER — Other Ambulatory Visit: Payer: Self-pay | Admitting: *Deleted

## 2013-06-15 DIAGNOSIS — I4891 Unspecified atrial fibrillation: Secondary | ICD-10-CM

## 2013-06-18 ENCOUNTER — Encounter: Payer: Self-pay | Admitting: Internal Medicine

## 2013-06-21 ENCOUNTER — Other Ambulatory Visit (INDEPENDENT_AMBULATORY_CARE_PROVIDER_SITE_OTHER): Payer: Medicare Other

## 2013-06-21 ENCOUNTER — Encounter (HOSPITAL_COMMUNITY): Payer: Self-pay | Admitting: Pharmacy Technician

## 2013-06-21 DIAGNOSIS — I4891 Unspecified atrial fibrillation: Secondary | ICD-10-CM

## 2013-06-21 LAB — CBC WITH DIFFERENTIAL/PLATELET
Basophils Relative: 0.4 % (ref 0.0–3.0)
Eosinophils Absolute: 0.1 10*3/uL (ref 0.0–0.7)
HCT: 39.8 % (ref 36.0–46.0)
Hemoglobin: 13.2 g/dL (ref 12.0–15.0)
Lymphocytes Relative: 8 % — ABNORMAL LOW (ref 12.0–46.0)
MCHC: 33.3 g/dL (ref 30.0–36.0)
Monocytes Relative: 10.4 % (ref 3.0–12.0)
Neutro Abs: 6.6 10*3/uL (ref 1.4–7.7)
RBC: 4.21 Mil/uL (ref 3.87–5.11)

## 2013-06-21 LAB — BASIC METABOLIC PANEL
CO2: 28 mEq/L (ref 19–32)
Calcium: 9.3 mg/dL (ref 8.4–10.5)
Potassium: 3.9 mEq/L (ref 3.5–5.1)
Sodium: 140 mEq/L (ref 135–145)

## 2013-06-24 ENCOUNTER — Encounter (HOSPITAL_COMMUNITY): Payer: Self-pay

## 2013-06-24 ENCOUNTER — Encounter (HOSPITAL_COMMUNITY)
Admission: RE | Disposition: A | Discharge status: Home / Self Care | Payer: Self-pay | Source: Ambulatory Visit | Attending: Cardiovascular Disease

## 2013-06-24 ENCOUNTER — Ambulatory Visit (HOSPITAL_COMMUNITY)
Admission: RE | Admit: 2013-06-24 | Discharge: 2013-06-24 | Disposition: A | Discharge status: Home / Self Care | Payer: Medicare Other | Source: Ambulatory Visit | Attending: Cardiovascular Disease | Admitting: Cardiovascular Disease

## 2013-06-24 DIAGNOSIS — I4892 Unspecified atrial flutter: Secondary | ICD-10-CM | POA: Insufficient documentation

## 2013-06-24 DIAGNOSIS — I4891 Unspecified atrial fibrillation: Secondary | ICD-10-CM | POA: Insufficient documentation

## 2013-06-24 DIAGNOSIS — Z7901 Long term (current) use of anticoagulants: Secondary | ICD-10-CM | POA: Insufficient documentation

## 2013-06-24 HISTORY — PX: TEE WITHOUT CARDIOVERSION: SHX5443

## 2013-06-24 SURGERY — ECHOCARDIOGRAM, TRANSESOPHAGEAL
Anesthesia: Moderate Sedation

## 2013-06-24 MED ORDER — SODIUM CHLORIDE 0.9 % IV SOLN: INTRAVENOUS | Status: DC

## 2013-06-24 MED ORDER — FENTANYL CITRATE 0.05 MG/ML IJ SOLN
INTRAMUSCULAR | Status: AC
  Filled 2013-06-24: qty 2

## 2013-06-24 MED ORDER — FENTANYL CITRATE 0.05 MG/ML IJ SOLN
INTRAMUSCULAR | Status: DC | PRN
  Administered 2013-06-24 (×2): 12.5 ug via INTRAVENOUS
  Administered 2013-06-24: 25 ug via INTRAVENOUS

## 2013-06-24 MED ORDER — MIDAZOLAM HCL 10 MG/2ML IJ SOLN
INTRAMUSCULAR | Status: DC | PRN
  Administered 2013-06-24 (×3): 1 mg via INTRAVENOUS

## 2013-06-24 MED ORDER — MIDAZOLAM HCL 5 MG/ML IJ SOLN
INTRAMUSCULAR | Status: AC
  Filled 2013-06-24: qty 2

## 2013-06-24 NOTE — CV Procedure (Signed)
    Transesophageal Echocardiogram Note  [name] [id number] November 26, 1930  Procedure: Transesophageal Echocardiogram Indications: atrial fib  Procedure Details Consent: Obtained Time Out: Verified patient identification, verified procedure, site/side was marked, verified correct patient position, special equipment/implants available, Radiology Safety Procedures followed,  medications/allergies/relevent history reviewed, required imaging and test results available.  Performed  Medications: Fentanyl: 50 mcg IV Versed: 3 mg IV  Left Ventrical:  Normal LV function  Mitral Valve: mild MR  Aortic Valve: normal  AV  Tricuspid Valve: normal TV  Pulmonic Valve: trace PI, otherwise normal PV  Left Atrium/ Left atrial appendage: large LAA, no thrombus  Atrial septum: intact  Aorta: normal   Complications: No apparent complications Patient did tolerate procedure well.   [name], [name]., MD, [facility] 06/24/2013, 11:34 AM

## 2013-06-24 NOTE — Interval H&P Note (Signed)
History and Physical Interval Note:  06/24/2013 11:16 AM  [name]  has presented today for surgery, with the diagnosis of Afib  The various methods of treatment have been discussed with the patient and family. After consideration of risks, benefits and other options for treatment, the patient has consented to  Procedure(s): TRANSESOPHAGEAL ECHOCARDIOGRAM (TEE) (N/A) as a surgical intervention .  The patient's history has been reviewed, patient examined, no change in status, stable for surgery.  I have reviewed the patient's chart and labs.  Questions were answered to the patient's satisfaction.     Elyn Aquas.

## 2013-06-24 NOTE — Progress Notes (Signed)
Echocardiogram Echocardiogram Transesophageal has been performed.  [name] 06/24/2013, 11:12 AM

## 2013-06-24 NOTE — H&P (View-Only) (Signed)
Primary Care Physician: Dr Kevan Ny Referring Physician:  Dr Einar Crow [name] is a 79 y.o. female with a h/o persistent atrial fibrillation and atrial flutter who presents for EP consultation.  She reports initially being diagnosed with atrial fibrillation 2 years ago.  She presented to Dr Kevan Ny for routine physical and was found to have afib with V rates 120s.  She was admitted overnight observation.  She converted to sinus rhythm.  She has had several visits during which she was in afib since.  In general, she is active.  She volunteers frequently.  She does have fatigue with her atrial arrhythmias.  She presented 12/13 with atrial flutter and underwent cardioversion.  She returned to atrial fibrillation and was placed on amiodarone.  She was again cardioverted 4/14.  She does not recall significant improvement in symptoms with sinus rhythm.  She returns today with atrial flutter despite medical therapy with amiodarone. Today, she denies symptoms of palpitations, chest pain, shortness of breath, orthopnea, PND,   dizziness, presyncope, syncope, or neurologic sequela.  + swelling at night. The patient is tolerating medications without difficulties and is otherwise without complaint today.   Past Medical History  Diagnosis Date  . Diverticulosis   . Fibrocystic breast disease   . DJD (degenerative joint disease), multiple sites   . Osteopenia   . Macular pucker, left eye   . Sensory hearing loss, bilateral   . Presbycusis   . Colon polyp   . Persistent atrial fibrillation     s/p prior cardioversion  . Atrial flutter    Past Surgical History  Procedure Laterality Date  . Abdominal hysterectomy      with BSO  . Appendectomy    . Rotator cuff repair    . Eye srugery    . Cataract extraction, bilateral    . Cardioversion  09/18/2012    Procedure: CARDIOVERSION;  Surgeon: Quintella Reichert, MD;  Location: [facility] ENDOSCOPY;  Service: Cardiovascular;  Laterality: N/A;  . Cardioversion  N/A 01/05/2013    Procedure: CARDIOVERSION;  Surgeon: Quintella Reichert, MD;  Location: [facility] ENDOSCOPY;  Service: Cardiovascular;  Laterality: N/A;    Current Outpatient Prescriptions  Medication Sig Dispense Refill  . amiodarone (PACERONE) 200 MG tablet Take 200 mg by mouth daily.      Marland Kitchen b complex vitamins tablet Take 1 tablet by mouth daily.      . Calcium Carbonate-Vitamin D (CALCIUM-VITAMIN D) 500-200 MG-UNIT per tablet Take 1 tablet by mouth daily.       . metoprolol succinate (TOPROL-XL) 25 MG 24 hr tablet Take 50 mg by mouth daily.      . Multiple Vitamins-Minerals (ICAPS PO) Take 1 tablet by mouth daily.      . Rivaroxaban (XARELTO) 15 MG TABS tablet Take 15 mg by mouth daily.       No current facility-administered medications for this visit.    Allergies  Allergen Reactions  . Penicillins Itching and Swelling    History   Social History  . Marital Status: Widowed    Spouse Name: N/A    Number of Children: N/A  . Years of Education: N/A   Occupational History  . Not on file.   Social History Main Topics  . Smoking status: Never Smoker   . Smokeless tobacco: Not on file  . Alcohol Use: No  . Drug Use: No  . Sexual Activity:    Other Topics Concern  . Not on file   Social  History Narrative   Lives in [place] alone.  Retired.    Family History  Problem Relation Age of Onset  . Cancer Father   . Cancer Mother   . Cancer Brother   . Cancer Sister     ROS- All systems are reviewed and negative except as per the HPI above  Physical Exam: Filed Vitals:   06/07/13 1525  BP: 130/80  Pulse: 100  Height: 5' 3.5" (1.613 m)  Weight: 138 lb (62.596 kg)    GEN- The patient is elderly but pleasant appearing, alert and oriented x 3 today.   Head- normocephalic, atraumatic Eyes-  Sclera clear, conjunctiva pink Ears- hearing intact Oropharynx- clear Neck- supple, no JVP Lymph- no cervical lymphadenopathy Lungs- Clear to ausculation bilaterally, normal work of  breathing Heart- irregular rate and rhythm, no murmurs, rubs or gallops, PMI not laterally displaced GI- soft, NT, ND, + BS Extremities- no clubbing, cyanosis, or edema MS- no significant deformity or atrophy Skin- no rash or lesion Psych- euthymic mood, full affect Neuro- strength and sensation are intact  EKG today reveals typical appearing atrial flutter Dr Malachy Mood office notes and epic records are reviewed today  Assessment and Plan:  The patient has symptomatic atrial fibrillation and typical appearing atrial flutter.  She has failed medical therapy with amiodarone.  Therapeutic strategies for afib and atrial flutter including medicine and ablation were discussed in detail with the patient today. Risk, benefits, and alternatives to EP study and radiofrequency ablation for afib were also discussed in detail today. These risks include but are not limited to stroke, bleeding, vascular damage, tamponade, perforation, damage to the esophagus, lungs, and other structures, pulmonary vein stenosis, worsening renal function, and death. The patient understands these risk and wishes to proceed.  We will therefore proceed with catheter ablation at the next available time. We will obtain a 2D echo to evaluate for structural changes in the interim. She will require a TEE prior to her ablation.

## 2013-06-25 ENCOUNTER — Encounter (HOSPITAL_COMMUNITY): Payer: Self-pay | Admitting: Cardiovascular Disease

## 2013-06-25 ENCOUNTER — Encounter (HOSPITAL_COMMUNITY)
Admission: RE | Disposition: A | Discharge status: Home / Self Care | Payer: Self-pay | Source: Ambulatory Visit | Attending: Internal Medicine

## 2013-06-25 ENCOUNTER — Encounter (HOSPITAL_COMMUNITY): Payer: Self-pay | Admitting: Certified Registered Nurse Anesthetist

## 2013-06-25 ENCOUNTER — Ambulatory Visit (HOSPITAL_COMMUNITY)
Admission: RE | Admit: 2013-06-25 | Discharge: 2013-06-26 | Disposition: A | Discharge status: Home / Self Care | Payer: Medicare Other | Source: Ambulatory Visit | Attending: Internal Medicine | Admitting: Internal Medicine

## 2013-06-25 ENCOUNTER — Ambulatory Visit (HOSPITAL_COMMUNITY): Payer: Medicare Other | Admitting: Certified Registered Nurse Anesthetist

## 2013-06-25 DIAGNOSIS — I4891 Unspecified atrial fibrillation: Secondary | ICD-10-CM

## 2013-06-25 DIAGNOSIS — I4892 Unspecified atrial flutter: Secondary | ICD-10-CM | POA: Insufficient documentation

## 2013-06-25 DIAGNOSIS — Z7901 Long term (current) use of anticoagulants: Secondary | ICD-10-CM | POA: Insufficient documentation

## 2013-06-25 DIAGNOSIS — M1990 Unspecified osteoarthritis, unspecified site: Secondary | ICD-10-CM | POA: Insufficient documentation

## 2013-06-25 DIAGNOSIS — Z79899 Other long term (current) drug therapy: Secondary | ICD-10-CM | POA: Insufficient documentation

## 2013-06-25 DIAGNOSIS — N6019 Diffuse cystic mastopathy of unspecified breast: Secondary | ICD-10-CM | POA: Insufficient documentation

## 2013-06-25 HISTORY — PX: ATRIAL FIBRILLATION ABLATION: SHX5456

## 2013-06-25 LAB — POCT ACTIVATED CLOTTING TIME
Activated Clotting Time: 293 seconds
Activated Clotting Time: 155 seconds

## 2013-06-25 SURGERY — ATRIAL FIBRILLATION ABLATION
Anesthesia: Monitor Anesthesia Care

## 2013-06-25 MED ORDER — AMIODARONE HCL 200 MG PO TABS
200.0000 mg | ORAL_TABLET | Freq: Every evening | ORAL | Status: DC
  Administered 2013-06-25: 200 mg via ORAL
  Filled 2013-06-25 (×2): qty 1

## 2013-06-25 MED ORDER — SODIUM CHLORIDE 0.9 % IV SOLN
INTRAVENOUS | Status: DC
  Administered 2013-06-25: 1000 mL via INTRAVENOUS

## 2013-06-25 MED ORDER — ACETAMINOPHEN 325 MG PO TABS
650.0000 mg | ORAL_TABLET | ORAL | Status: DC | PRN
  Administered 2013-06-25: 650 mg via ORAL
  Filled 2013-06-25: qty 2

## 2013-06-25 MED ORDER — FENTANYL CITRATE 0.05 MG/ML IJ SOLN: 25.0000 ug | INTRAMUSCULAR | Status: DC | PRN

## 2013-06-25 MED ORDER — SODIUM CHLORIDE 0.9 % IV SOLN: 250.0000 mL | INTRAVENOUS | Status: DC | PRN

## 2013-06-25 MED ORDER — ADENOSINE 6 MG/2ML IV SOLN
INTRAVENOUS | Status: AC
  Filled 2013-06-25: qty 6

## 2013-06-25 MED ORDER — PHENYLEPHRINE HCL 10 MG/ML IJ SOLN
INTRAMUSCULAR | Status: DC | PRN
  Administered 2013-06-25: 40 ug via INTRAVENOUS
  Administered 2013-06-25: 120 ug via INTRAVENOUS
  Administered 2013-06-25: 80 ug via INTRAVENOUS
  Administered 2013-06-25: 40 ug via INTRAVENOUS

## 2013-06-25 MED ORDER — FENTANYL CITRATE 0.05 MG/ML IJ SOLN
INTRAMUSCULAR | Status: DC | PRN
  Administered 2013-06-25 (×2): 25 ug via INTRAVENOUS
  Administered 2013-06-25: 50 ug via INTRAVENOUS

## 2013-06-25 MED ORDER — HYDROCODONE-ACETAMINOPHEN 5-325 MG PO TABS
1.0000 | ORAL_TABLET | ORAL | Status: DC | PRN
  Administered 2013-06-25 (×2): 1 via ORAL
  Filled 2013-06-25 (×2): qty 1

## 2013-06-25 MED ORDER — LIDOCAINE HCL (CARDIAC) 20 MG/ML IV SOLN
INTRAVENOUS | Status: DC | PRN
  Administered 2013-06-25: 50 mg via INTRAVENOUS

## 2013-06-25 MED ORDER — MIDAZOLAM HCL 5 MG/5ML IJ SOLN
INTRAMUSCULAR | Status: DC | PRN
  Administered 2013-06-25: 2 mg via INTRAVENOUS
  Administered 2013-06-25 (×2): 1 mg via INTRAVENOUS

## 2013-06-25 MED ORDER — ONDANSETRON HCL 4 MG/2ML IJ SOLN: 4.0000 mg | Freq: Four times a day (QID) | INTRAMUSCULAR | Status: DC | PRN

## 2013-06-25 MED ORDER — PROTAMINE SULFATE 10 MG/ML IV SOLN
INTRAVENOUS | Status: DC | PRN
  Administered 2013-06-25: 40 mg via INTRAVENOUS

## 2013-06-25 MED ORDER — ONDANSETRON HCL 4 MG/2ML IJ SOLN
INTRAMUSCULAR | Status: DC | PRN
  Administered 2013-06-25: 4 mg via INTRAVENOUS

## 2013-06-25 MED ORDER — HEPARIN SODIUM (PORCINE) 1000 UNIT/ML IJ SOLN
INTRAMUSCULAR | Status: DC | PRN
  Administered 2013-06-25: 1000 [IU] via INTRAVENOUS

## 2013-06-25 MED ORDER — PROPOFOL 10 MG/ML IV BOLUS
INTRAVENOUS | Status: DC | PRN
  Administered 2013-06-25: 20 mg via INTRAVENOUS
  Administered 2013-06-25: 40 mg via INTRAVENOUS
  Administered 2013-06-25: 20 mg via INTRAVENOUS

## 2013-06-25 MED ORDER — SODIUM CHLORIDE 0.9 % IJ SOLN: 3.0000 mL | INTRAMUSCULAR | Status: DC | PRN

## 2013-06-25 MED ORDER — RIVAROXABAN 15 MG PO TABS
15.0000 mg | ORAL_TABLET | Freq: Every day | ORAL | Status: DC
  Administered 2013-06-25: 15 mg via ORAL
  Filled 2013-06-25 (×2): qty 1

## 2013-06-25 MED ORDER — HEPARIN SODIUM (PORCINE) 1000 UNIT/ML IJ SOLN
INTRAMUSCULAR | Status: AC
  Filled 2013-06-25: qty 1

## 2013-06-25 MED ORDER — PROPOFOL INFUSION 10 MG/ML OPTIME
INTRAVENOUS | Status: DC | PRN
  Administered 2013-06-25: 50 ug/kg/min via INTRAVENOUS

## 2013-06-25 MED ORDER — EPHEDRINE SULFATE 50 MG/ML IJ SOLN
INTRAMUSCULAR | Status: DC | PRN
  Administered 2013-06-25: 10 mg via INTRAVENOUS

## 2013-06-25 MED ORDER — SODIUM CHLORIDE 0.9 % IJ SOLN
3.0000 mL | Freq: Two times a day (BID) | INTRAMUSCULAR | Status: DC
  Administered 2013-06-25: 3 mL via INTRAVENOUS

## 2013-06-25 MED ORDER — LACTATED RINGERS IV SOLN
INTRAVENOUS | Status: DC | PRN
  Administered 2013-06-25: 09:00:00 via INTRAVENOUS

## 2013-06-25 NOTE — Anesthesia Postprocedure Evaluation (Signed)
  Anesthesia Post-op Note  Patient: [name]  Procedure(s) Performed: Procedure(s): ATRIAL FIBRILLATION ABLATION (N/A)  Patient Location: Cath Lab  Anesthesia Type:MAC  Level of Consciousness: awake, alert  and oriented  Airway and Oxygen Therapy: Patient Spontanous Breathing  Post-op Pain: none  Post-op Assessment: Post-op Vital signs reviewed, Patient's Cardiovascular Status Stable, Respiratory Function Stable, Patent Airway and No signs of Nausea or vomiting  Post-op Vital Signs: Reviewed and stable  Complications: No apparent anesthesia complications

## 2013-06-25 NOTE — Discharge Summary (Signed)
ELECTROPHYSIOLOGY PROCEDURE DISCHARGE SUMMARY    Patient ID: [name],  MRN: [id number], DOB/AGE: [birth year] 79 y.o.  Admit date: 06/25/2013 Discharge date: 06/26/2013  Primary Care Physician: Johnella Moloney, MD Primary Cardiologist: Armanda Magic, MD  Primary Discharge Diagnosis:  Persistent atrial fibrillation and atrial flutter status post electrophysiology study and radiofrequency catheter ablation this admission  Secondary Discharge Diagnosis:  1.  DJD 2.  Firbrocystic breast disease 3.  Chronic anticoagulation with Xarelto  Procedures This Admission:  1.  Electrophysiology study and radiofrequency catheter ablation by Dr Johney Frame on 06-25-2013.  This study demonstrated Isthmus dependant right atrial flutter upon presentation. Rotational Angiography reveals a moderate sized left atrium with a common left PV ostium. Successful electrical isolation and anatomical encircling of all four pulmonary veins with radiofrequency current. Cavo-tricuspid isthmus ablation was performed with complete bidirectional isthmus block achieved. A second atypical atrial flutter was identified but could not be mapped due to a paucity of signal within the right atrium. This atrial flutter was successfully cardioverted to sinus.  There were no early apparent complications.   Brief HPI: [name] is a 79 y.o. female with a history of atrial fibrillation and appearing atrial flutter who now presents for EP study and radiofrequency ablation. The patient reports initially being diagnosed with atrial fibrillation after presenting with symptomatic palpitations and fatgiue. The patient reports increasing frequency and duration of atrial arrhythmias since that time. The patient has failed medical therapy with amiodarone. The patient therefore presents today for catheter ablation of atrial fibrillation and atrial flutter. TEE was performed prior to admission and demonstrated no LAA thrombus.   Hospital  Course:  The patient was admitted and underwent EPS/RFCA with details as outlined above.  She was monitored on telemetry overnight which demonstrated NSR.  Her groin was without complication the morning after procedure and she was ambulating without difficulty.  Wound care and restrictions were reviewed with the patient.  Routine follow up was scheduled.  She did c/o sore throat and cough as well as pleuritic CP.  This was reviewed with Dr. Hillis Range and felt to be fairly common after this procedure.  Patient was reassured her symptoms should resolve in several days.  The patient was seen by Dr. Arvilla Meres and considered stable for discharge.  She will go home on her same medications plus Protonix 40 mg QD for GI protection.   Discharge Vitals: Blood pressure 106/50, pulse 58, temperature 98.6 F (37 C), temperature source Oral, resp. rate 18, height 5\' 3"  (1.6 m), weight 149 lb 14.6 oz (68 kg), SpO2 94.00%.    Physical Exam: PHYSICAL EXAM: Well nourished, well developed, in no acute distress HEENT: normal Neck: no JVD at 90 degrees Cardiac:  normal S1, S2; RRR; no murmur Lungs:  clear to auscultation bilaterally, no wheezing, rhonchi or rales Abd: soft, nontender  Ext: no edema; right groin with dressing intact with small amount of dried blood, no bruit  Skin: warm and dry Neuro:  CNs 2-12 intact, no focal abnormalities noted   Labs:   Lab Results  Component Value Date   WBC 8.3 06/21/2013   HGB 13.2 06/21/2013   HCT 39.8 06/21/2013   MCV 94.5 06/21/2013   PLT 205.0 06/21/2013     Recent Labs Lab 06/26/13 0530  NA 139  K 4.3  CL 107  CO2 25  BUN 15  CREATININE 0.94  CALCIUM 8.8  GLUCOSE 102*    Discharge Medications:    Medication List  amiodarone 200 MG tablet  Commonly known as:  PACERONE  Take 200 mg by mouth every evening.     b complex vitamins tablet  Take 1 tablet by mouth every morning.     calcium-vitamin D 500-200 MG-UNIT per tablet  Take  1 tablet by mouth every morning.     ICAPS PO  Take 1 tablet by mouth every morning.     metoprolol succinate 25 MG 24 hr tablet  Commonly known as:  TOPROL-XL  Take 50 mg by mouth every morning.     pantoprazole 40 MG tablet  Commonly known as:  PROTONIX  Take 1 tablet (40 mg total) by mouth daily. Take for 6 weeks total     Rivaroxaban 15 MG Tabs tablet  Commonly known as:  XARELTO  Take 15 mg by mouth daily after supper.        Disposition:  Discharge Orders   Future Appointments Provider Department Dept Phone   09/29/2013 3:15 PM Hillis Range, MD [facility] [place] Office [phone]   Future Orders Complete By Expires   Diet - low sodium heart healthy  As directed    Discharge wound care:  As directed    Comments:     Call [phone] for any swelling bleeding bruising or fever.   Increase activity slowly  As directed        Duration of Discharge Encounter: Greater than 30 minutes including physician time.  Signed, Tereso Newcomer, PA-C   06/26/2013 11:44 AM      Patient seen and examined with Tereso Newcomer, PA-C. We discussed all aspects of the encounter. I agree with the assessment and plan as stated above.   She is in NSR s/p ablation. Chest a bit sore but not unexpected. We have d/w Dr. Johney Frame who is comfortable with discharge today. She will go home on above regimen and f/u in AF clinic. Return to ED if CP getting worse.   Mikele Sifuentes,MD 11:57 AM

## 2013-06-25 NOTE — Anesthesia Preprocedure Evaluation (Addendum)
Anesthesia Evaluation  Patient identified by MRN, date of birth, ID band Patient awake    Reviewed: Allergy & Precautions, H&P , NPO status , Patient's Chart, lab work & pertinent test results, reviewed documented beta blocker date and time   Airway Mallampati: II TM Distance: >3 FB Neck ROM: Full    Dental no notable dental hx. (+) Teeth Intact and Dental Advisory Given   Pulmonary neg pulmonary ROS,  breath sounds clear to auscultation  Pulmonary exam normal       Cardiovascular + dysrhythmias Atrial Fibrillation Rhythm:Irregular Rate:Normal     Neuro/Psych negative neurological ROS  negative psych ROS   GI/Hepatic negative GI ROS, Neg liver ROS,   Endo/Other  negative endocrine ROS  Renal/GU negative Renal ROS  negative genitourinary   Musculoskeletal   Abdominal   Peds  Hematology negative hematology ROS (+)   Anesthesia Other Findings   Reproductive/Obstetrics negative OB ROS                          Anesthesia Physical Anesthesia Plan  ASA: III  Anesthesia Plan: MAC   Post-op Pain Management:    Induction: Intravenous  Airway Management Planned: Simple Face Mask  Additional Equipment:   Intra-op Plan:   Post-operative Plan:   Informed Consent: I have reviewed the patients History and Physical, chart, labs and discussed the procedure including the risks, benefits and alternatives for the proposed anesthesia with the patient or authorized representative who has indicated his/her understanding and acceptance.   Dental advisory given  Plan Discussed with: CRNA and Surgeon  Anesthesia Plan Comments:        Anesthesia Quick Evaluation

## 2013-06-25 NOTE — Anesthesia Procedure Notes (Signed)
Procedure Name: MAC Date/Time: 06/25/2013 7:42 AM Performed by: Margaree Mackintosh Pre-anesthesia Checklist: Patient identified, Timeout performed, Emergency Drugs available, Suction available and Patient being monitored Patient Re-evaluated:Patient Re-evaluated prior to inductionOxygen Delivery Method: Simple face mask Preoxygenation: Pre-oxygenation with 100% oxygen Intubation Type: IV induction Placement Confirmation: positive ETCO2 Dental Injury: Teeth and Oropharynx as per pre-operative assessment

## 2013-06-25 NOTE — Preoperative (Signed)
Beta Blockers   Reason not to administer Beta Blockers:Not Applicable 

## 2013-06-25 NOTE — Interval H&P Note (Signed)
History and Physical Interval Note:  06/25/2013 7:47 AM  [name]  has presented today for surgery, with the diagnosis of AFib  The various methods of treatment have been discussed with the patient and family. After consideration of risks, benefits and other options for treatment, the patient has consented to  Procedure(s): ATRIAL FIBRILLATION ABLATION (N/A) as a surgical intervention .  The patient's history has been reviewed, patient examined, no change in status, stable for surgery.  I have reviewed the patient's chart and labs.  Questions were answered to the patient's satisfaction.     Hillis Range

## 2013-06-25 NOTE — Transfer of Care (Signed)
Immediate Anesthesia Transfer of Care Note  Patient: [name]  Procedure(s) Performed: Procedure(s): ATRIAL FIBRILLATION ABLATION (N/A)  Patient Location: Cath Lab  Anesthesia Type:MAC  Level of Consciousness: awake, alert  and oriented  Airway & Oxygen Therapy: Patient Spontanous Breathing  Post-op Assessment: Report given to PACU RN and Post -op Vital signs reviewed and stable  Post vital signs: Reviewed and stable  Complications: No apparent anesthesia complications

## 2013-06-25 NOTE — Op Note (Signed)
SURGEON:  Hillis Range, MD  PREPROCEDURE DIAGNOSES: 1. Persistent atrial fibrillation. 2. Typical appearing atrial flutter  POSTPROCEDURE DIAGNOSES: 1. Paroxysmal  atrial fibrillation. 2. Isthmus dependant right atrial flutter 3. Atypical right atrial flutter  PROCEDURES: 1. Comprehensive electrophysiologic study. 2. Coronary sinus pacing and recording. 3. Three-dimensional mapping of atrial fibrillation with additional mapping and ablation of a second discrete focus (atrial flutter) 4. Ablation of atrial fibrillation with additional mapping and ablation of a second discrete focus (atrial flutter) 5. Intracardiac echocardiography. 6. Transseptal puncture of an intact septum. 7. Rotational Angiography with processing at an independent workstation 8. External cardioversion (elective)  INTRODUCTION:  [name] is a 79 y.o. female with a history of atrial fibrillation and appearing atrial flutter who now presents for EP study and radiofrequency ablation.  The patient reports initially being diagnosed with atrial fibrillation after presenting with symptomatic palpitations and fatgiue. The patient reports increasing frequency and duration of atrial arrhythmias since that time.  The patient has failed medical therapy with amiodarone.  The patient therefore presents today for catheter ablation of atrial fibrillation and atrial flutter.  DESCRIPTION OF PROCEDURE:  Informed written consent was obtained, and the patient was brought to the electrophysiology lab in a fasting state.  The patient was adequately sedated with intravenous medications as outlined in the anesthesia report.  The patient's left and right groins were prepped and draped in the usual sterile fashion by the EP lab staff.  Using a percutaneous Seldinger technique, two 7-French and one 11-French hemostasis sheaths were placed into the right common femoral vein.    3 Dimensional Rotational Angiography: A 5 french pigtail  catheter was introduced through the right common femoral vein and advanced into the inferior venocava.  3 demential rotational angiography was then performed by power injection of 100cc of nonionic contrast.  Reprocessing at an independent work station was then performed.   This demonstrated a moderate sized left atrium with a common ostium to the left atrium.  The pulmonary veins were moderate in size.  There were no anomalous veins or significant abnormalities.  A 3 dimensional rendering of the left atrium was then merged using [employer] onto the Well[employer] system and registered with intracardiac echo (see below).  The pigtail catheter was then removed.  Catheter Placement:  A 7-French [employer] Decapolar coronary sinus catheter was introduced through the right common femoral vein and advanced into the coronary sinus for recording and pacing from this location.  A quadrapolar catheter was introduced through the right common femoral vein and advanced into the right ventricle for recording and pacing.  This catheter was then pulled back to the His bundle location.    Initial Measurements: The patient presented to the electrophysiology lab in typical appearing atrial flutter.  The patients QRS duration of 182 msec.  The HV interval measured 54 msec.     Intracardiac Echocardiography: A 10-French [employer] AcuNav intracardiac echocardiography catheter was introduced through the left common femoral vein and advanced into the right atrium. Intracardiac echocardiography was performed of the left atrium, and a three-dimensional anatomical rendering of the left atrium was performed using CARTO sound technology.  The patient was noted to have a moderate sized left atrium.  The interatrial septum was prominent but not aneurysmal. All 4 pulmonary veins were visualized.  There was a common left PV ostium.  There was also a small right middle pulmonary vein.  The other pulmonary veins  were moderate in size.  The left  atrial appendage was visualized and did not reveal thrombus.   There was no evidence of pulmonary vein stenosis.   Transseptal Puncture: The middle right common femoral vein sheath was exchanged for an 8.5 [place] SL2 transseptal sheath and transseptal access was achieved in a standard fashion using a Brockenbrough needle under biplane fluoroscopy with intracardiac echocardiography confirmation of the transseptal puncture.  Once transseptal access had been achieved, heparin was administered intravenously and intra- arterially in order to maintain an ACT of greater than 300 seconds throughout the procedure.   3D Mapping and Ablation: The His bundle catheter was removed and in its place a 3.5 mm [employer] ablation catheter was advanced into the right atrium.  The transseptal sheath was pulled back into the IVC over a guidewire.  The ablation catheter was advanced across the transseptal hole using the wire as a guide.  The transseptal sheath was then re-advanced over the guidewire into the left atrium.  A duodecapolar [employer] circular mapping catheter was introduced through the transseptal sheath and positioned over the mouth of all 4 pulmonary veins.  Three-dimensional electroanatomical mapping was performed using CARTO technology.  This demonstrated electrical activity within all four pulmonary veins at baseline. The patient underwent successful sequential electrical isolation and anatomical encircling of all four pulmonary veins using radiofrequency current with a circular mapping catheter as a guide.  The pulmonary veins were isolated using a WACA approach.  The left PVs were isolated together. The ablation catheter was then pulled back into the right atrial and positioned along the cavo-tricuspid isthmus.  Mapping along the atrial side of the isthmus was performed.  This demonstrated a standard isthmus.  The atrial flutter cycle length  was with proximal to distal CS activation.  Entrainment was suggested of isthmus dependant right atrial flutter.  A series of radiofrequency applications were then delivered along the isthmus.  The tachycardia slowed but did not terminate.  The activation sequence did change but remained somewhat proximal to distal.  Entrainment was difficult from the isthmus due to extensive isthmus ablation.  There was a paucity of electrogram signal within the right atrium and therefore additional mapping of this focus could not be performed today.  I therefore elected to perform cardioversion.  The patient was successfully cardioverted to sinus rhythm with a single synchronized 360J shock delivered.  She remained in sinus rhythm thereafter.  Complete bidirectional cavotricuspid isthmus block was achieved as confirmed by differential atrial pacing from the low lateral right atrium.  A stimulus to earliest atrial activation across the isthmus measured 150 msec bi-directionally.  The patient was observe without return of conduction through the isthmus.  Measurements Following Ablation: Following ablation, in sinus rhythm with RR interval was 1243 msec, with PR 230 msec, QRS 88 msec, and Qt 542 msec.  Following ablation the AH interval measured 118 msec with an HV interval of 57 msec. Ventricular pacing was performed, which revealed VA dissociation when pacing at 600 msec.  Rapid atrial pacing was performed, which revealed an AV Wenckebach cycle length of 650 msec.    Intracardiac echocardiography was again performed, which revealed no pericardial effusion.  The procedure was therefore considered completed.  All catheters were removed, and the sheaths were aspirated and flushed.  The patient was transferred to the recovery area for sheath removal per protocol.  A limited bedside transthoracic echocardiogram revealed no pericardial effusion.  There were no early apparent complications.  CONCLUSIONS: 1. Isthmus  dependant right atrial flutter upon  presentation.   2. Rotational Angiography reveals a moderate sized left atrium with a common left PV ostium 3. Successful electrical isolation and anatomical encircling of all four pulmonary veins with radiofrequency current.    4. Cavo-tricuspid isthmus ablation was performed with complete bidirectional isthmus block achieved.  5. A second atypical atrial flutter was identified but could not be mapped due to a paucity of signal within the right atrium.  This atrial flutter was successfully cardioverted to sinus 6. No early apparent complications.   Dimitria Ketchum,MD 11:00 AM 06/25/2013

## 2013-06-25 NOTE — H&P (View-Only) (Signed)
 Primary Care Physician: Dr Gates Referring Physician:  Dr Turner   [name] is a 79 y.o. female with a h/o persistent atrial fibrillation and atrial flutter who presents for EP consultation.  She reports initially being diagnosed with atrial fibrillation 2 years ago.  She presented to Dr Gates for routine physical and was found to have afib with V rates 120s.  She was admitted overnight observation.  She converted to sinus rhythm.  She has had several visits during which she was in afib since.  In general, she is active.  She volunteers frequently.  She does have fatigue with her atrial arrhythmias.  She presented 12/13 with atrial flutter and underwent cardioversion.  She returned to atrial fibrillation and was placed on amiodarone.  She was again cardioverted 4/14.  She does not recall significant improvement in symptoms with sinus rhythm.  She returns today with atrial flutter despite medical therapy with amiodarone. Today, she denies symptoms of palpitations, chest pain, shortness of breath, orthopnea, PND,   dizziness, presyncope, syncope, or neurologic sequela.  + swelling at night. The patient is tolerating medications without difficulties and is otherwise without complaint today.   Past Medical History  Diagnosis Date  . Diverticulosis   . Fibrocystic breast disease   . DJD (degenerative joint disease), multiple sites   . Osteopenia   . Macular pucker, left eye   . Sensory hearing loss, bilateral   . Presbycusis   . Colon polyp   . Persistent atrial fibrillation     s/p prior cardioversion  . Atrial flutter    Past Surgical History  Procedure Laterality Date  . Abdominal hysterectomy      with BSO  . Appendectomy    . Rotator cuff repair    . Eye srugery    . Cataract extraction, bilateral    . Cardioversion  09/18/2012    Procedure: CARDIOVERSION;  Surgeon: Traci R Turner, MD;  Location: [facility] ENDOSCOPY;  Service: Cardiovascular;  Laterality: N/A;  . Cardioversion  N/A 01/05/2013    Procedure: CARDIOVERSION;  Surgeon: Traci R Turner, MD;  Location: [facility] ENDOSCOPY;  Service: Cardiovascular;  Laterality: N/A;    Current Outpatient Prescriptions  Medication Sig Dispense Refill  . amiodarone (PACERONE) 200 MG tablet Take 200 mg by mouth daily.      . b complex vitamins tablet Take 1 tablet by mouth daily.      . Calcium Carbonate-Vitamin D (CALCIUM-VITAMIN D) 500-200 MG-UNIT per tablet Take 1 tablet by mouth daily.       . metoprolol succinate (TOPROL-XL) 25 MG 24 hr tablet Take 50 mg by mouth daily.      . Multiple Vitamins-Minerals (ICAPS PO) Take 1 tablet by mouth daily.      . Rivaroxaban (XARELTO) 15 MG TABS tablet Take 15 mg by mouth daily.       No current facility-administered medications for this visit.    Allergies  Allergen Reactions  . Penicillins Itching and Swelling    History   Social History  . Marital Status: Widowed    Spouse Name: N/A    Number of Children: N/A  . Years of Education: N/A   Occupational History  . Not on file.   Social History Main Topics  . Smoking status: Never Smoker   . Smokeless tobacco: Not on file  . Alcohol Use: No  . Drug Use: No  . Sexual Activity:    Other Topics Concern  . Not on file   Social   History Narrative   Lives in [place] alone.  Retired.    Family History  Problem Relation Age of Onset  . Cancer Father   . Cancer Mother   . Cancer Brother   . Cancer Sister     ROS- All systems are reviewed and negative except as per the HPI above  Physical Exam: Filed Vitals:   06/07/13 1525  BP: 130/80  Pulse: 100  Height: 5' 3.5" (1.613 m)  Weight: 138 lb (62.596 kg)    GEN- The patient is elderly but pleasant appearing, alert and oriented x 3 today.   Head- normocephalic, atraumatic Eyes-  Sclera clear, conjunctiva pink Ears- hearing intact Oropharynx- clear Neck- supple, no JVP Lymph- no cervical lymphadenopathy Lungs- Clear to ausculation bilaterally, normal work of  breathing Heart- irregular rate and rhythm, no murmurs, rubs or gallops, PMI not laterally displaced GI- soft, NT, ND, + BS Extremities- no clubbing, cyanosis, or edema MS- no significant deformity or atrophy Skin- no rash or lesion Psych- euthymic mood, full affect Neuro- strength and sensation are intact  EKG today reveals typical appearing atrial flutter Dr Turners office notes and epic records are reviewed today  Assessment and Plan:  The patient has symptomatic atrial fibrillation and typical appearing atrial flutter.  She has failed medical therapy with amiodarone.  Therapeutic strategies for afib and atrial flutter including medicine and ablation were discussed in detail with the patient today. Risk, benefits, and alternatives to EP study and radiofrequency ablation for afib were also discussed in detail today. These risks include but are not limited to stroke, bleeding, vascular damage, tamponade, perforation, damage to the esophagus, lungs, and other structures, pulmonary vein stenosis, worsening renal function, and death. The patient understands these risk and wishes to proceed.  We will therefore proceed with catheter ablation at the next available time. We will obtain a 2D echo to evaluate for structural changes in the interim. She will require a TEE prior to her ablation.    

## 2013-06-26 DIAGNOSIS — I4891 Unspecified atrial fibrillation: Secondary | ICD-10-CM

## 2013-06-26 DIAGNOSIS — I4892 Unspecified atrial flutter: Secondary | ICD-10-CM

## 2013-06-26 LAB — BASIC METABOLIC PANEL
CO2: 25 mEq/L (ref 19–32)
Calcium: 8.8 mg/dL (ref 8.4–10.5)
Creatinine, Ser: 0.94 mg/dL (ref 0.50–1.10)
Glucose, Bld: 102 mg/dL — ABNORMAL HIGH (ref 70–99)
Sodium: 139 mEq/L (ref 135–145)

## 2013-06-26 MED ORDER — PANTOPRAZOLE SODIUM 40 MG PO TBEC: 40.0000 mg | DELAYED_RELEASE_TABLET | Freq: Every day | ORAL | Status: DC

## 2013-06-28 ENCOUNTER — Telehealth: Payer: Self-pay | Admitting: Internal Medicine

## 2013-06-28 NOTE — Telephone Encounter (Signed)
Spoke with patient and let her know that bruising is to be expected as well as a knot in the groin.  The bruising will go down her leg and then start to fade.  She is also complaining of a dry cough of which she has had since before the ablation but seems worse now.  I let her know I would discuss with Dr Johney Frame and return her call

## 2013-06-28 NOTE — Telephone Encounter (Signed)
Pt  Has a bruise on her groin after ablation, it has spread today, pls advise, thinks she needs to be seen tomorrow

## 2013-06-29 NOTE — Telephone Encounter (Addendum)
I called the patient and she feels better, she does not cough at all while you are talking with her on the phone. She says she looked up Amiodarone on t

## 2013-06-30 ENCOUNTER — Ambulatory Visit (INDEPENDENT_AMBULATORY_CARE_PROVIDER_SITE_OTHER): Payer: Medicare Other | Admitting: Internal Medicine

## 2013-06-30 ENCOUNTER — Ambulatory Visit (HOSPITAL_COMMUNITY): Payer: Medicare Other | Attending: Internal Medicine

## 2013-06-30 VITALS — BP 166/107 | HR 117 | Ht 63.5 in | Wt 143.0 lb

## 2013-06-30 DIAGNOSIS — M7981 Nontraumatic hematoma of soft tissue: Secondary | ICD-10-CM | POA: Insufficient documentation

## 2013-06-30 DIAGNOSIS — R109 Unspecified abdominal pain: Secondary | ICD-10-CM

## 2013-06-30 DIAGNOSIS — I4891 Unspecified atrial fibrillation: Secondary | ICD-10-CM | POA: Insufficient documentation

## 2013-06-30 DIAGNOSIS — R0989 Other specified symptoms and signs involving the circulatory and respiratory systems: Secondary | ICD-10-CM

## 2013-06-30 DIAGNOSIS — R1030 Lower abdominal pain, unspecified: Secondary | ICD-10-CM

## 2013-06-30 DIAGNOSIS — R229 Localized swelling, mass and lump, unspecified: Secondary | ICD-10-CM

## 2013-06-30 MED ORDER — FUROSEMIDE 40 MG PO TABS: 40.0000 mg | ORAL_TABLET | Freq: Every day | ORAL | Status: DC

## 2013-06-30 NOTE — Patient Instructions (Addendum)
Your physician recommends that you schedule a follow-up appointment in: 2 weeks with Dr Johney Frame   Your physician recommends that you return for lab work today BMP/CBC  Your physician has recommended you make the following change in your medication:  1) Increase Amiodarone to 200mg  twice daily  Your physician has requested that you have a lower or upper extremity arterial duplex. This test is an ultrasound of the arteries in the legs or arms. It looks at arterial blood flow in the legs and arms. Allow one hour for Lower and Upper Arterial scans. There are no restrictions or special instructions    Take Furosemide 40mg  for 3 days to help with swelling then stop

## 2013-06-30 NOTE — Telephone Encounter (Signed)
I have asked her to come today to have Dr Johney Frame look at her groin site

## 2013-06-30 NOTE — Progress Notes (Signed)
The patient presents today for electrophysiology followup.  She underwent afib ablation last week.  She called today stating that she had groin ecchymosis with tenderness.  She also reports 5 lb weight gain since leaving the hospital. Today, she denies symptoms of palpitations, chest pain, shortness of breath, orthopnea, PND, lower extremity edema, dizziness, presyncope, syncope, or neurologic sequela.  The patient feels that she is tolerating medications without difficulties and is otherwise without complaint today.   Past Medical History  Diagnosis Date  . Diverticulosis   . Fibrocystic breast disease   . DJD (degenerative joint disease), multiple sites   . Osteopenia   . Macular pucker, left eye   . Sensory hearing loss, bilateral   . Presbycusis   . Colon polyp   . Persistent atrial fibrillation     s/p prior cardioversion  . Atrial flutter    Past Surgical History  Procedure Laterality Date  . Abdominal hysterectomy      with BSO  . Appendectomy    . Rotator cuff repair    . Eye srugery      macular pucker  . Cataract extraction, bilateral    . Cardioversion  09/18/2012    Procedure: CARDIOVERSION;  Surgeon: Quintella Reichert, MD;  Location: [facility] ENDOSCOPY;  Service: Cardiovascular;  Laterality: N/A;  . Cardioversion N/A 01/05/2013    Procedure: CARDIOVERSION;  Surgeon: Quintella Reichert, MD;  Location: [facility] ENDOSCOPY;  Service: Cardiovascular;  Laterality: N/A;  . Tonsillectomy    . Tee without cardioversion N/A 06/24/2013    Procedure: TRANSESOPHAGEAL ECHOCARDIOGRAM (TEE);  Surgeon: Vesta Mixer, MD;  Location: [facility] ENDOSCOPY;  Service: Cardiovascular;  Laterality: N/A;    Current Outpatient Prescriptions  Medication Sig Dispense Refill  . amiodarone (PACERONE) 200 MG tablet Take 200 mg by mouth every evening.       Marland Kitchen b complex vitamins tablet Take 1 tablet by mouth every morning.       . Calcium Carbonate-Vitamin D (CALCIUM-VITAMIN D) 500-200 MG-UNIT per tablet Take 1 tablet by  mouth every morning.       . metoprolol succinate (TOPROL-XL) 25 MG 24 hr tablet Take 50 mg by mouth every morning.       . Multiple Vitamins-Minerals (ICAPS PO) Take 1 tablet by mouth every morning.       . pantoprazole (PROTONIX) 40 MG tablet Take 1 tablet (40 mg total) by mouth daily. Take for 6 weeks total  30 tablet  1  . Rivaroxaban (XARELTO) 15 MG TABS tablet Take 15 mg by mouth daily after supper.       . furosemide (LASIX) 40 MG tablet Take 1 tablet (40 mg total) by mouth daily.  30 tablet  0   No current facility-administered medications for this visit.    Allergies  Allergen Reactions  . Penicillins Itching and Swelling    History   Social History  . Marital Status: Widowed    Spouse Name: N/A    Number of Children: N/A  . Years of Education: N/A   Occupational History  . Not on file.   Social History Main Topics  . Smoking status: Never Smoker   . Smokeless tobacco: Not on file  . Alcohol Use: No  . Drug Use: No  . Sexual Activity: Not on file   Other Topics Concern  . Not on file   Social History Narrative   Lives in [place] alone.  Retired.    Family History  Problem Relation Age of Onset  .  Cancer Father   . Cancer Mother   . Cancer Brother   . Cancer Sister     Physical Exam: Filed Vitals:   06/30/13 1641  BP: 166/107  Pulse: 117  Height: 5' 3.5" (1.613 m)  Weight: 143 lb (64.864 kg)    GEN- The patient is elderly appearing, alert and oriented x 3 today.  NAD Head- normocephalic, atraumatic Eyes-  Sclera clear, conjunctiva pink Ears- hearing intact Oropharynx- clear Neck- supple  Lungs- Clear to ausculation bilaterally, normal work of breathing Heart- tachycardic irregular rhythm GI- soft, NT, ND, + BS Extremities- no clubbing, cyanosis, or edema, + moderate ecchymosis of R groin with small hematoma/ no bruit MS- no significant deformity or atrophy Skin- no rash or lesion Psych- euthymic mood, full affect Neuro- strength and  sensation are intact  ekg today reveals afib, V rate 109 bpm, nonspecific ST/ T changes R groin [place] today reveals no pseudoaneurysm/aneurysm  Assessment and Plan:  1. afib ERAF post ablation Increase amiodarone to 200mg  BID  2. Volume overload Appears minimal but may be due to fluids given with anesthesia Will give lasix 40mg  daily x 3 days bmet today  3. R groin hematoma [place] reviewed Supportive care Check CBC today  Return in 2 weeks for follow-up with me

## 2013-06-30 NOTE — Telephone Encounter (Signed)
New Problem:  Pt states she has a hematoma and it is painful. Please advised

## 2013-07-01 ENCOUNTER — Other Ambulatory Visit: Payer: Self-pay | Admitting: *Deleted

## 2013-07-01 LAB — CBC WITH DIFFERENTIAL/PLATELET
Basophils Absolute: 0 10*3/uL (ref 0.0–0.1)
Eosinophils Absolute: 0.3 10*3/uL (ref 0.0–0.7)
HCT: 37.3 % (ref 36.0–46.0)
Hemoglobin: 12.5 g/dL (ref 12.0–15.0)
Lymphocytes Relative: 7.4 % — ABNORMAL LOW (ref 12.0–46.0)
Lymphs Abs: 0.6 10*3/uL — ABNORMAL LOW (ref 0.7–4.0)
MCHC: 33.6 g/dL (ref 30.0–36.0)
Monocytes Absolute: 0.8 10*3/uL (ref 0.1–1.0)
Neutro Abs: 6.6 10*3/uL (ref 1.4–7.7)
Platelets: 233 10*3/uL (ref 150.0–400.0)
RBC: 3.96 Mil/uL (ref 3.87–5.11)
RDW: 14.3 % (ref 11.5–14.6)

## 2013-07-01 LAB — BASIC METABOLIC PANEL
Calcium: 9 mg/dL (ref 8.4–10.5)
Creatinine, Ser: 0.9 mg/dL (ref 0.4–1.2)
GFR: 62.42 mL/min (ref >60.00)
Glucose, Bld: 105 mg/dL — ABNORMAL HIGH (ref 70–99)
Potassium: 3.8 mEq/L (ref 3.5–5.1)
Sodium: 139 mEq/L (ref 135–145)

## 2013-07-01 MED ORDER — FUROSEMIDE 40 MG PO TABS: ORAL_TABLET | ORAL | Status: DC

## 2013-07-13 ENCOUNTER — Encounter: Payer: Self-pay | Admitting: Cardiology

## 2013-07-13 ENCOUNTER — Ambulatory Visit (INDEPENDENT_AMBULATORY_CARE_PROVIDER_SITE_OTHER): Payer: Medicare Other | Admitting: Cardiology

## 2013-07-13 ENCOUNTER — Ambulatory Visit (INDEPENDENT_AMBULATORY_CARE_PROVIDER_SITE_OTHER)
Admission: RE | Admit: 2013-07-13 | Discharge: 2013-07-13 | Disposition: A | Discharge status: Home / Self Care | Payer: Medicare Other | Source: Ambulatory Visit | Attending: Cardiology | Admitting: Cardiology

## 2013-07-13 VITALS — BP 126/88 | HR 99 | Ht 63.5 in | Wt 136.0 lb

## 2013-07-13 DIAGNOSIS — R0602 Shortness of breath: Secondary | ICD-10-CM

## 2013-07-13 DIAGNOSIS — R5383 Other fatigue: Secondary | ICD-10-CM

## 2013-07-13 DIAGNOSIS — I4891 Unspecified atrial fibrillation: Secondary | ICD-10-CM

## 2013-07-13 DIAGNOSIS — I484 Atypical atrial flutter: Secondary | ICD-10-CM

## 2013-07-13 DIAGNOSIS — R5381 Other malaise: Secondary | ICD-10-CM

## 2013-07-13 DIAGNOSIS — I4892 Unspecified atrial flutter: Secondary | ICD-10-CM

## 2013-07-13 LAB — CBC WITH DIFFERENTIAL/PLATELET
Basophils Relative: 0.2 % (ref 0.0–3.0)
Eosinophils Absolute: 0 10*3/uL (ref 0.0–0.7)
Eosinophils Relative: 0.3 % (ref 0.0–5.0)
HCT: 38.4 % (ref 36.0–46.0)
Lymphocytes Relative: 5.3 % — ABNORMAL LOW (ref 12.0–46.0)
MCHC: 32.8 g/dL (ref 30.0–36.0)
Monocytes Absolute: 1.1 10*3/uL — ABNORMAL HIGH (ref 0.1–1.0)
Monocytes Relative: 11.3 % (ref 3.0–12.0)
Neutro Abs: 8.2 10*3/uL — ABNORMAL HIGH (ref 1.4–7.7)
Neutrophils Relative %: 82.9 % — ABNORMAL HIGH (ref 43.0–77.0)
RBC: 4.02 Mil/uL (ref 3.87–5.11)
WBC: 9.9 10*3/uL (ref 4.5–10.5)

## 2013-07-13 LAB — BASIC METABOLIC PANEL
BUN: 16 mg/dL (ref 6–23)
CO2: 27 mEq/L (ref 19–32)
Chloride: 103 mEq/L (ref 96–112)
GFR: 58.02 mL/min — ABNORMAL LOW (ref >60.00)
Potassium: 4.2 mEq/L (ref 3.5–5.1)

## 2013-07-13 NOTE — Patient Instructions (Addendum)
A chest x-ray takes a picture of the organs and structures inside the chest, including the heart, lungs, and blood vessels. This test can show several things, including, whether the heart is enlarges; whether fluid is building up in the lungs; and whether pacemaker / defibrillator leads are still in place.  Your physician recommends that you return for lab work in: CBC,BMET TODAY   Your physician recommends that you schedule a follow-up appointment in: WITH DR. [name] ON 07-15-2013 AT 8:45 AM

## 2013-07-14 ENCOUNTER — Ambulatory Visit: Payer: Medicare Other | Admitting: Internal Medicine

## 2013-07-14 NOTE — Progress Notes (Signed)
ELECTROPHYSIOLOGY OFFICE NOTE  Patient ID: MEYLIN STENZEL MRN: [id number], DOB/AGE: [birth year]   Date of Visit: 07/13/2013  Primary Physician: Kevan Ny, MD Primary Cardiologist / EP: Mayford Knife, MD / Johney Frame, MD Reason for Visit: 2 week follow-up  History of Present Illness  [name] is a 79 y.o. female with PAF s/p AFib ablation 06/25/2013 who presents today for 2 week follow-up. She was evaluated by Dr. Johney Frame on 06/30/2013 for right groin hematoma post procedure. [place] was done and was negative for pseudoaneurysm and AV fistula. She was provided reassurance and instructions regarding care for the groin site. Also at that visit she was found to have ERAF and her amiodarone dose was increased to 200 mg twice daily.    Since last being seen in our clinic, she reports she is not doing well and has multiple concerns. She is accompanied by her daughter who is tearful about her mother's decreased functional status. [name] reports increased fatigue and DOE since the ablation procedure. She reports having "no energy" to perform ADLs and DOE with minimal exertion. She reports constant chest "tightness" and "aggravating" cough which is non-productive. She denies SOB at rest. She denies fever or chills. She denies palpitations, dizziness, near syncope or syncope. She has noticed persistent LE swelling for which Dr. Johney Frame gave Lasix x 3 days; however, her swelling returned. She denies orthopnea or PND. She is compliant with her medications. Her groin site is improving. She reports chronic back pain which has increased in severity over the last 2 weeks and she is requesting Celebrex.  Past Medical History Past Medical History  Diagnosis Date  . Diverticulosis   . Fibrocystic breast disease   . DJD (degenerative joint disease), multiple sites   . Osteopenia   . Macular pucker, left eye   . Sensory hearing loss, bilateral   . Presbycusis   . Colon polyp   . Persistent atrial fibrillation    s/p prior cardioversion  . Atrial flutter     Past Surgical History Past Surgical History  Procedure Laterality Date  . Abdominal hysterectomy      with BSO  . Appendectomy    . Rotator cuff repair    . Eye srugery      macular pucker  . Cataract extraction, bilateral    . Cardioversion  09/18/2012    Procedure: CARDIOVERSION;  Surgeon: Quintella Reichert, MD;  Location: [facility] ENDOSCOPY;  Service: Cardiovascular;  Laterality: N/A;  . Cardioversion N/A 01/05/2013    Procedure: CARDIOVERSION;  Surgeon: Quintella Reichert, MD;  Location: [facility] ENDOSCOPY;  Service: Cardiovascular;  Laterality: N/A;  . Tonsillectomy    . Tee without cardioversion N/A 06/24/2013    Procedure: TRANSESOPHAGEAL ECHOCARDIOGRAM (TEE);  Surgeon: Vesta Mixer, MD;  Location: [facility] ENDOSCOPY;  Service: Cardiovascular;  Laterality: N/A;    Allergies/Intolerances Allergies  Allergen Reactions  . Penicillins Itching and Swelling    Current Home Medications Current Outpatient Prescriptions  Medication Sig Dispense Refill  . amiodarone (PACERONE) 200 MG tablet Take 200 mg by mouth every evening.       Marland Kitchen b complex vitamins tablet Take 1 tablet by mouth every morning.       . Calcium Carbonate-Vitamin D (CALCIUM-VITAMIN D) 500-200 MG-UNIT per tablet Take 1 tablet by mouth every morning.       . furosemide (LASIX) 40 MG tablet TAKE ONE TABLET BY MOUTH FOR THREE DAYS AND THEN STOP.  30 tablet  0  . metoprolol succinate (TOPROL-XL) 25 MG  24 hr tablet Take 50 mg by mouth every morning.       . Multiple Vitamins-Minerals (ICAPS PO) Take 1 tablet by mouth every morning.       . pantoprazole (PROTONIX) 40 MG tablet Take 1 tablet (40 mg total) by mouth daily. Take for 6 weeks total  30 tablet  1  . Rivaroxaban (XARELTO) 15 MG TABS tablet Take 15 mg by mouth daily after supper.        No current facility-administered medications for this visit.    Social History Social History  . Marital Status: Widowed   Social History Main Topics    . Smoking status: Never Smoker   . Smokeless tobacco: Not on file  . Alcohol Use: No  . Drug Use: No   Social History Narrative   Lives in [place] alone.  Retired.    Review of Systems General: No chills, fever, night sweats or weight changes Cardiovascular: No chest pain, dyspnea on exertion, edema, orthopnea, palpitations, paroxysmal nocturnal dyspnea Dermatological: No rash, lesions or masses Respiratory: No cough, dyspnea Urologic: No hematuria, dysuria Abdominal: No nausea, vomiting, diarrhea, bright red blood per rectum, melena, or hematemesis Neurologic: No visual changes, weakness, changes in mental status All other systems reviewed and are otherwise negative except as noted above.  Physical Exam Vitals: Blood pressure 126/88, pulse 99, height 5' 3.5" (1.613 m), weight 136 lb (61.689 kg), SpO2 97.00%.  General: Well developed, well appearing 79 y.o. female in no acute distress. HEENT: Normocephalic, atraumatic. EOMs intact. Sclera nonicteric. Oropharynx clear.  Neck: Supple without bruits. No JVD. Lungs: Respirations regular and unlabored, CTA bilaterally. No wheezes, rales or rhonchi. Heart: RRR. S1, S2 present. No murmurs, rub, S3 or S4. Abdomen: Soft, non-tender, non-distended. BS present x 4 quadrants. No hepatosplenomegaly.  Extremities: No clubbing or cyanosis. Trace pedal edema at ankles bilaterally. DP/PT/Radials 2+ and equal bilaterally. Psych: Normal affect. Neuro: Alert and oriented X 3. Moves all extremities spontaneously.   Diagnostics 12-lead ECG today shows atypical atrial flutter at 93 bpm; reviewed with Dr. Johney Frame TEE 06/24/2013 Left Ventricle: Normal LV function  Mitral Valve: mild MR  Aortic Valve: normal AV  Tricuspid Valve: normal TV  Pulmonic Valve: trace PI, otherwise normal PV  Left Atrium/ Left atrial appendage: large LAA, no thrombus  Atrial septum: intact  Aorta: normal  Complications: No apparent complications. Patient tolerated  procedure well.  Assessment and Plan 1. Fatigue, DOE, chest "tightness" and LE swelling 2. Atypical atrial flutter 3. Atrial fibrillation s/p AFib ablation 06/25/2013 4. Typical appearing atrial flutter s/p CTI ablation 06/25/2013 5. Normal LV function  [name] presents approximately 3 weeks post AFib ablation with multiple concerns and symptoms. I discussed her symptoms / concerns and reviewed her ECG with Dr. Johney Frame who recommended we check CBC and BMET today as well as CXR. Consider echo pending those findings. Her symptoms may be all related to atypical atrial flutter. Her rate is currently controlled. Dr. Johney Frame recommended early follow-up with him on Thursday, 07/15/2013 which was arranged for her to discuss possible cardioversion.    Signed, Rick Duff, PA-C 07/14/2013, 10:47 AM

## 2013-07-15 ENCOUNTER — Encounter: Payer: Medicare Other | Admitting: Internal Medicine

## 2013-07-15 ENCOUNTER — Encounter: Payer: Self-pay | Admitting: Internal Medicine

## 2013-07-15 ENCOUNTER — Ambulatory Visit (INDEPENDENT_AMBULATORY_CARE_PROVIDER_SITE_OTHER): Payer: Medicare Other | Admitting: Internal Medicine

## 2013-07-15 ENCOUNTER — Encounter: Payer: Self-pay | Admitting: *Deleted

## 2013-07-15 VITALS — BP 124/78 | HR 99 | Ht 63.5 in | Wt 135.0 lb

## 2013-07-15 DIAGNOSIS — J90 Pleural effusion, not elsewhere classified: Secondary | ICD-10-CM

## 2013-07-15 DIAGNOSIS — R0602 Shortness of breath: Secondary | ICD-10-CM

## 2013-07-15 DIAGNOSIS — I4892 Unspecified atrial flutter: Secondary | ICD-10-CM

## 2013-07-15 DIAGNOSIS — I4891 Unspecified atrial fibrillation: Secondary | ICD-10-CM

## 2013-07-15 MED ORDER — AMIODARONE HCL 200 MG PO TABS: 200.0000 mg | ORAL_TABLET | Freq: Two times a day (BID) | ORAL | Status: DC

## 2013-07-15 NOTE — Patient Instructions (Addendum)
Your physician recommends that you schedule a follow-up appointment in: 07/21/13 with Dr [name]--at 10:30  Come here as soon as you are finished getting CXR done  Chest xray to be done on 07/21/13 at [facility] office  Please be there at 9:00am   A chest x-ray takes a picture of the organs and structures inside the chest, including the heart, lungs, and blood vessels. This test can show several things, including, whether the heart is enlarges; whether fluid is building up in the lungs; and whether pacemaker / defibrillator leads are still in place.  07/21/13 at 9am   Your physician recommends that you return for lab work : BMP/BNP/CBC--on 07/21/13 at [facility] office at [facility]  Your physician has recommended that you have a Cardioversion (DCCV). Electrical Cardioversion uses a jolt of electricity to your heart either through paddles or wired patches attached to your chest. This is a controlled, usually prescheduled, procedure. Defibrillation is done under light anesthesia in the hospital, and you usually go home the day of the procedure. This is done to get your heart back into a normal rhythm. You are not awake for the procedure. Please see the instruction sheet given to you today.---Dr Myrtis Ser

## 2013-07-18 NOTE — Progress Notes (Signed)
The patient presents today for electrophysiology followup.  Her groin has healed.  Unfortunately her SOB has worsened.  She has occasional cough, worse at night.  Her primary concern is with worsening of her chronic back pain. Today, she denies symptoms of palpitations, chest pain,  lower extremity edema, dizziness, presyncope, syncope, or neurologic sequela.  The patient feels that she is tolerating medications without difficulties and is otherwise without complaint today.   Past Medical History  Diagnosis Date  . Diverticulosis   . Fibrocystic breast disease   . DJD (degenerative joint disease), multiple sites   . Osteopenia   . Macular pucker, left eye   . Sensory hearing loss, bilateral   . Presbycusis   . Colon polyp   . Persistent atrial fibrillation     s/p prior cardioversion  . Atrial flutter    Past Surgical History  Procedure Laterality Date  . Abdominal hysterectomy      with BSO  . Appendectomy    . Rotator cuff repair    . Eye srugery      macular pucker  . Cataract extraction, bilateral    . Cardioversion  09/18/2012    Procedure: CARDIOVERSION;  Surgeon: Quintella Reichert, MD;  Location: [facility] ENDOSCOPY;  Service: Cardiovascular;  Laterality: N/A;  . Cardioversion N/A 01/05/2013    Procedure: CARDIOVERSION;  Surgeon: Quintella Reichert, MD;  Location: [facility] ENDOSCOPY;  Service: Cardiovascular;  Laterality: N/A;  . Tonsillectomy    . Tee without cardioversion N/A 06/24/2013    Procedure: TRANSESOPHAGEAL ECHOCARDIOGRAM (TEE);  Surgeon: Vesta Mixer, MD;  Location: [facility] ENDOSCOPY;  Service: Cardiovascular;  Laterality: N/A;    Current Outpatient Prescriptions  Medication Sig Dispense Refill  . amiodarone (PACERONE) 200 MG tablet Take 1 tablet (200 mg total) by mouth 2 (two) times daily.      Marland Kitchen b complex vitamins tablet Take 1 tablet by mouth every morning.       . Calcium Carbonate-Vitamin D (CALCIUM-VITAMIN D) 500-200 MG-UNIT per tablet Take 1 tablet by mouth every morning.        . furosemide (LASIX) 40 MG tablet Take 40 mg by mouth daily.      . metoprolol succinate (TOPROL-XL) 25 MG 24 hr tablet Take 50 mg by mouth every morning.       . Multiple Vitamins-Minerals (ICAPS PO) Take 1 tablet by mouth every morning.       . pantoprazole (PROTONIX) 40 MG tablet Take 1 tablet (40 mg total) by mouth daily. Take for 6 weeks total  30 tablet  1  . Rivaroxaban (XARELTO) 15 MG TABS tablet Take 15 mg by mouth daily after supper.        No current facility-administered medications for this visit.    Allergies  Allergen Reactions  . Penicillins Itching and Swelling    History   Social History  . Marital Status: Widowed    Spouse Name: N/A    Number of Children: N/A  . Years of Education: N/A   Occupational History  . Not on file.   Social History Main Topics  . Smoking status: Never Smoker   . Smokeless tobacco: Not on file  . Alcohol Use: No  . Drug Use: No  . Sexual Activity: Not on file   Other Topics Concern  . Not on file   Social History Narrative   Lives in [place] alone.  Retired.    Family History  Problem Relation Age of Onset  . Cancer Father   .  Cancer Mother   . Cancer Brother   . Cancer Sister     Physical Exam: Filed Vitals:   07/15/13 0853  BP: 124/78  Pulse: 99  Height: 5' 3.5" (1.613 m)  Weight: 135 lb (61.236 kg)    GEN- The patient is elderly appearing, alert and oriented x 3 today.  NAD Head- normocephalic, atraumatic Eyes-  Sclera clear, conjunctiva pink Ears- hearing intact Oropharynx- clear Neck- supple  Lungs- Clear to ausculation bilaterally, normal work of breathing Heart- tachycardic irregular rhythm GI- soft, NT, ND, + BS Extremities- no clubbing, cyanosis, or edema, groin ecchymosis is improving Neuro- strength and sensation are intact  ekg today reveals atypical atrial flutter, V rate 96 bpm, nonspecific ST/ T changes CXR reveals small to moderate L pleural effusion, no pneumonia  Assessment and  Plan:  1. afib ERAF post ablation Continue amiodarone, consider cardioversion next week  2. Volume overload Appears minimal but may be due to fluids given with anesthesia Continue lasix bmet upon return  3. R groin hematoma improved  Return in 1 week for follow-up with me Plan repeat cxr and labs same day.  She may require cardioversion at that time as well.

## 2013-07-21 ENCOUNTER — Other Ambulatory Visit (INDEPENDENT_AMBULATORY_CARE_PROVIDER_SITE_OTHER): Payer: Medicare Other

## 2013-07-21 ENCOUNTER — Ambulatory Visit (INDEPENDENT_AMBULATORY_CARE_PROVIDER_SITE_OTHER)
Admission: RE | Admit: 2013-07-21 | Discharge: 2013-07-21 | Disposition: A | Discharge status: Home / Self Care | Payer: Medicare Other | Source: Ambulatory Visit | Attending: Internal Medicine | Admitting: Internal Medicine

## 2013-07-21 ENCOUNTER — Ambulatory Visit (HOSPITAL_COMMUNITY)
Admission: RE | Admit: 2013-07-21 | Discharge: 2013-07-21 | Disposition: A | Discharge status: Home / Self Care | Payer: Medicare Other | Source: Ambulatory Visit | Attending: Cardiology | Admitting: Cardiology

## 2013-07-21 ENCOUNTER — Encounter: Payer: Self-pay | Admitting: Internal Medicine

## 2013-07-21 ENCOUNTER — Encounter (HOSPITAL_COMMUNITY): Payer: Medicare Other | Admitting: Certified Registered Nurse Anesthetist

## 2013-07-21 ENCOUNTER — Encounter (HOSPITAL_COMMUNITY): Payer: Self-pay | Admitting: Certified Registered Nurse Anesthetist

## 2013-07-21 ENCOUNTER — Ambulatory Visit (INDEPENDENT_AMBULATORY_CARE_PROVIDER_SITE_OTHER): Payer: Medicare Other | Admitting: Internal Medicine

## 2013-07-21 ENCOUNTER — Ambulatory Visit (HOSPITAL_COMMUNITY): Payer: Medicare Other | Admitting: Certified Registered Nurse Anesthetist

## 2013-07-21 ENCOUNTER — Encounter (HOSPITAL_COMMUNITY)
Admission: RE | Disposition: A | Discharge status: Home / Self Care | Payer: Self-pay | Source: Ambulatory Visit | Attending: Cardiology

## 2013-07-21 VITALS — BP 131/86 | HR 102 | Ht 63.5 in | Wt 135.5 lb

## 2013-07-21 DIAGNOSIS — H35379 Puckering of macula, unspecified eye: Secondary | ICD-10-CM | POA: Insufficient documentation

## 2013-07-21 DIAGNOSIS — N6019 Diffuse cystic mastopathy of unspecified breast: Secondary | ICD-10-CM | POA: Insufficient documentation

## 2013-07-21 DIAGNOSIS — K219 Gastro-esophageal reflux disease without esophagitis: Secondary | ICD-10-CM | POA: Insufficient documentation

## 2013-07-21 DIAGNOSIS — I4891 Unspecified atrial fibrillation: Secondary | ICD-10-CM

## 2013-07-21 DIAGNOSIS — R0602 Shortness of breath: Secondary | ICD-10-CM

## 2013-07-21 DIAGNOSIS — J90 Pleural effusion, not elsewhere classified: Secondary | ICD-10-CM

## 2013-07-21 DIAGNOSIS — Z860100 Personal history of colon polyps, unspecified: Secondary | ICD-10-CM | POA: Insufficient documentation

## 2013-07-21 DIAGNOSIS — H9110 Presbycusis, unspecified ear: Secondary | ICD-10-CM | POA: Insufficient documentation

## 2013-07-21 DIAGNOSIS — M1990 Unspecified osteoarthritis, unspecified site: Secondary | ICD-10-CM | POA: Insufficient documentation

## 2013-07-21 DIAGNOSIS — M899 Disorder of bone, unspecified: Secondary | ICD-10-CM | POA: Insufficient documentation

## 2013-07-21 DIAGNOSIS — K5730 Diverticulosis of large intestine without perforation or abscess without bleeding: Secondary | ICD-10-CM | POA: Insufficient documentation

## 2013-07-21 DIAGNOSIS — I4892 Unspecified atrial flutter: Secondary | ICD-10-CM

## 2013-07-21 HISTORY — PX: CARDIOVERSION: SHX1299

## 2013-07-21 LAB — CBC WITH DIFFERENTIAL/PLATELET
Basophils Relative: 0.2 % (ref 0.0–3.0)
Eosinophils Absolute: 0.1 10*3/uL (ref 0.0–0.7)
Eosinophils Relative: 1.5 % (ref 0.0–5.0)
HCT: 39.3 % (ref 36.0–46.0)
Hemoglobin: 13.1 g/dL (ref 12.0–15.0)
MCHC: 33.2 g/dL (ref 30.0–36.0)
MCV: 94 fl (ref 78.0–100.0)
Monocytes Absolute: 0.9 10*3/uL (ref 0.1–1.0)
Monocytes Relative: 10.8 % (ref 3.0–12.0)
Neutro Abs: 6.6 10*3/uL (ref 1.4–7.7)
Neutrophils Relative %: 82 % — ABNORMAL HIGH (ref 43.0–77.0)
RBC: 4.19 Mil/uL (ref 3.87–5.11)

## 2013-07-21 LAB — BASIC METABOLIC PANEL
CO2: 31 mEq/L (ref 19–32)
Calcium: 9.4 mg/dL (ref 8.4–10.5)
Chloride: 100 mEq/L (ref 96–112)
Glucose, Bld: 121 mg/dL — ABNORMAL HIGH (ref 70–99)
Sodium: 139 mEq/L (ref 135–145)

## 2013-07-21 SURGERY — CARDIOVERSION
Anesthesia: General

## 2013-07-21 MED ORDER — SODIUM CHLORIDE 0.9 % IV SOLN
INTRAVENOUS | Status: DC
  Administered 2013-07-21: 500 mL via INTRAVENOUS

## 2013-07-21 MED ORDER — LIDOCAINE HCL (CARDIAC) 20 MG/ML IV SOLN
INTRAVENOUS | Status: DC | PRN
  Administered 2013-07-21: 30 mg via INTRAVENOUS

## 2013-07-21 MED ORDER — PROPOFOL 10 MG/ML IV BOLUS
INTRAVENOUS | Status: DC | PRN
  Administered 2013-07-21: 50 mg via INTRAVENOUS

## 2013-07-21 NOTE — CV Procedure (Signed)
[facility]:  The patient was prepared for cardioversion. Proper consent was formed. Proper timeout was done.  Anesthesia was present. The patient received 50 mg of IV propofol.  Anterior posterior pads were placed. A biphasic defibrillator was used.  The patient received one shock with 120 J. She converted to sinus rhythm.  Successful cardioversion with one shock. The patient is stable. She will be allowed to go home with family when she is fully awake.  Jerral Bonito, MD

## 2013-07-21 NOTE — OR Nursing (Signed)
Had a small run of AF after cardioversion but reverted back to NSR.  Dr. Myrtis Ser aware

## 2013-07-21 NOTE — Transfer of Care (Signed)
Immediate Anesthesia Transfer of Care Note  Patient: [name]  Procedure(s) Performed: Procedure(s): CARDIOVERSION (N/A)  Patient Location: Short Stay  Anesthesia Type:General  Level of Consciousness: awake, alert  and oriented  Airway & Oxygen Therapy: Patient Spontanous Breathing and Patient connected to nasal cannula oxygen  Post-op Assessment: Report given to PACU RN and Post -op Vital signs reviewed and stable  Post vital signs: Reviewed and stable  Complications: No apparent anesthesia complications

## 2013-07-21 NOTE — Preoperative (Signed)
Beta Blockers   Reason not to administer Beta Blockers:Not Applicable 

## 2013-07-21 NOTE — Anesthesia Postprocedure Evaluation (Signed)
  Anesthesia Post-op Note  Patient: [name]  Procedure(s) Performed: Procedure(s): CARDIOVERSION (N/A)  Patient Location: Short Stay  Anesthesia Type:General  Level of Consciousness: awake, alert  and oriented  Airway and Oxygen Therapy: Patient Spontanous Breathing and Patient connected to nasal cannula oxygen  Post-op Pain: none  Post-op Assessment: Post-op Vital signs reviewed, Patient's Cardiovascular Status Stable, Respiratory Function Stable, Patent Airway and No signs of Nausea or vomiting  Post-op Vital Signs: Reviewed and stable  Complications: No apparent anesthesia complications

## 2013-07-21 NOTE — Patient Instructions (Signed)
Your physician recommends that you schedule a follow-up appointment in: 2 weeks with Dr. [name]  

## 2013-07-21 NOTE — H&P (Signed)
History and physical for cardioversion July 21, 2013.  Patient was seen in the office today by Dr. Johney Frame. See the note below. She is stable and now here at the hospital for cardioversion. We will proceed with cardioversion.     Progress Notes    Hillis Range, MD at 07/21/2013 11:10 AM    Status: Signed                   The patient presents today for electrophysiology followup.  Her SOB and cough are also improving.  She is pleased that she is making progress.  Today, she denies symptoms of palpitations, chest pain,  lower extremity edema, dizziness, presyncope, syncope, or neurologic sequela.  The patient feels that she is tolerating medications without difficulties and is otherwise without complaint today.     Past Medical History   Diagnosis  Date   .  Diverticulosis     .  Fibrocystic breast disease     .  DJD (degenerative joint disease), multiple sites     .  Osteopenia     .  Macular pucker, left eye     .  Sensory hearing loss, bilateral     .  Presbycusis     .  Colon polyp     .  Persistent atrial fibrillation         s/p prior cardioversion   .  Atrial flutter         Past Surgical History   Procedure  Laterality  Date   .  Abdominal hysterectomy           with BSO   .  Appendectomy       .  Rotator cuff repair       .  Eye srugery           macular pucker   .  Cataract extraction, bilateral       .  Cardioversion    09/18/2012       Procedure: CARDIOVERSION;  Surgeon: Quintella Reichert, MD;  Location: [facility] ENDOSCOPY;  Service: Cardiovascular;  Laterality: N/A;   .  Cardioversion  N/A  01/05/2013       Procedure: CARDIOVERSION;  Surgeon: Quintella Reichert, MD;  Location: [facility] ENDOSCOPY;  Service: Cardiovascular;  Laterality: N/A;   .  Tonsillectomy       .  Tee without cardioversion  N/A  06/24/2013       Procedure: TRANSESOPHAGEAL ECHOCARDIOGRAM (TEE);  Surgeon: Vesta Mixer, MD; Location: [facility] ENDOSCOPY;  Service: Cardiovascular;  Laterality: N/A;         Current  Outpatient Prescriptions   Medication  Sig  Dispense  Refill   .  amiodarone (PACERONE) 200 MG tablet  Take 1 tablet (200 mg total) by mouth 2 (two) times daily.         Marland Kitchen  b complex vitamins tablet  Take 1 tablet by mouth every morning.          .  Calcium Carbonate-Vitamin D (CALCIUM-VITAMIN D) 500-200 MG-UNIT per tablet  Take 1 tablet by mouth every morning.          .  furosemide (LASIX) 40 MG tablet  Take 40 mg by mouth daily.         .  metoprolol succinate (TOPROL-XL) 25 MG 24 hr tablet  Take 50 mg by mouth every morning.          .  Multiple Vitamins-Minerals (ICAPS PO)  Take 1  tablet by mouth every morning.          .  pantoprazole (PROTONIX) 40 MG tablet  Take 1 tablet (40 mg total) by mouth daily. Take for 6 weeks total   30 tablet   1   .  Rivaroxaban (XARELTO) 15 MG TABS tablet  Take 15 mg by mouth daily after supper.              No current facility-administered medications for this visit.         Allergies   Allergen  Reactions   .  Penicillins  Itching and Swelling         History       Social History   .  Marital Status:  Widowed       Spouse Name:  N/A       Number of Children:  N/A   .  Years of Education:  N/A       Occupational History   .  Not on file.       Social History Main Topics   .  Smoking status:  Never Smoker    .  Smokeless tobacco:  Not on file   .  Alcohol Use:  No   .  Drug Use:  No   .  Sexual Activity:  Not on file       Other Topics  Concern   .  Not on file       Social History Narrative     Lives in [place] alone.  Retired.         Family History   Problem  Relation  Age of Onset   .  Cancer  Father     .  Cancer  Mother     .  Cancer  Brother     .  Cancer  Sister          Physical Exam: Filed Vitals:     07/21/13 1047   BP:  131/86   Pulse:  102   Height:  5' 3.5" (1.613 m)   Weight:  135 lb 8 oz (61.462 kg)        GEN- The patient is elderly appearing, alert and oriented x 3 today.  NAD Head-  normocephalic, atraumatic Eyes-  Sclera clear, conjunctiva pink Ears- hearing intact Oropharynx- clear Neck- supple   Lungs- Clear to ausculation bilaterally, normal work of breathing Heart- tachycardic irregular rhythm, + pericardial friction rub GI- soft, NT, ND, + BS Extremities- no clubbing, cyanosis, or edema, groin ecchymosis is improving Neuro- strength and sensation are intact   ekg today reveals atypical atrial flutter, V rate 96 bpm, nonspecific ST/ T changes CXR reveals small to moderate L pleural effusion, no pneumonia   Assessment and Plan:   1. afib ERAF post ablation Continue amiodarone, will proceed with cardioversion this am.  She is appropriately anticoagulated with xarelto.   2. SOB Pleural effusion is stable Likely pericardial/ pleural inflammation post ablation Continue lasix 40mg  daily

## 2013-07-21 NOTE — Anesthesia Procedure Notes (Addendum)
Procedure Name: MAC Performed by: Margaree Mackintosh Pre-anesthesia Checklist: Patient identified, Emergency Drugs available, Suction available, Patient being monitored and Timeout performed Patient Re-evaluated:Patient Re-evaluated prior to inductionOxygen Delivery Method: Ambu bag Preoxygenation: Pre-oxygenation with 100% oxygen Intubation Type: IV induction Ventilation: Mask ventilation without difficulty Dental Injury: Teeth and Oropharynx as per pre-operative assessment

## 2013-07-21 NOTE — Anesthesia Preprocedure Evaluation (Addendum)
Anesthesia Evaluation  Patient identified by MRN, date of birth, ID band Patient awake    Reviewed: Allergy & Precautions, H&P , NPO status , Patient's Chart, lab work & pertinent test results, reviewed documented beta blocker date and time   History of Anesthesia Complications Negative for: history of anesthetic complications  Airway Mallampati: II TM Distance: >3 FB Neck ROM: Full    Dental  (+) Teeth Intact and Dental Advisory Given   Pulmonary neg pulmonary ROS,  breath sounds clear to auscultation  Pulmonary exam normal       Cardiovascular + dysrhythmias Atrial Fibrillation Rhythm:Irregular Rate:Normal  9/14 TEE: EF 60-65% with normal LVF, mild MR   Neuro/Psych negative neurological ROS  negative psych ROS   GI/Hepatic Neg liver ROS, GERD-  Medicated and Controlled,  Endo/Other  negative endocrine ROS  Renal/GU negative Renal ROS     Musculoskeletal   Abdominal   Peds  Hematology negative hematology ROS (+)   Anesthesia Other Findings   Reproductive/Obstetrics                          Anesthesia Physical Anesthesia Plan  ASA: III  Anesthesia Plan: General   Post-op Pain Management:    Induction: Intravenous  Airway Management Planned: Mask and Natural Airway  Additional Equipment:   Intra-op Plan:   Post-operative Plan:   Informed Consent: I have reviewed the patients History and Physical, chart, labs and discussed the procedure including the risks, benefits and alternatives for the proposed anesthesia with the patient or authorized representative who has indicated his/her understanding and acceptance.   Dental advisory given  Plan Discussed with: Anesthesiologist, Surgeon and CRNA  Anesthesia Plan Comments: (Plan routine monitors, GA for cardioversion)       Anesthesia Quick Evaluation

## 2013-07-21 NOTE — Progress Notes (Signed)
The patient presents today for electrophysiology followup.  Her SOB and cough are also improving.  She is pleased that she is making progress.  Today, she denies symptoms of palpitations, chest pain,  lower extremity edema, dizziness, presyncope, syncope, or neurologic sequela.  The patient feels that she is tolerating medications without difficulties and is otherwise without complaint today.   Past Medical History  Diagnosis Date  . Diverticulosis   . Fibrocystic breast disease   . DJD (degenerative joint disease), multiple sites   . Osteopenia   . Macular pucker, left eye   . Sensory hearing loss, bilateral   . Presbycusis   . Colon polyp   . Persistent atrial fibrillation     s/p prior cardioversion  . Atrial flutter    Past Surgical History  Procedure Laterality Date  . Abdominal hysterectomy      with BSO  . Appendectomy    . Rotator cuff repair    . Eye srugery      macular pucker  . Cataract extraction, bilateral    . Cardioversion  09/18/2012    Procedure: CARDIOVERSION;  Surgeon: Quintella Reichert, MD;  Location: [facility] ENDOSCOPY;  Service: Cardiovascular;  Laterality: N/A;  . Cardioversion N/A 01/05/2013    Procedure: CARDIOVERSION;  Surgeon: Quintella Reichert, MD;  Location: [facility] ENDOSCOPY;  Service: Cardiovascular;  Laterality: N/A;  . Tonsillectomy    . Tee without cardioversion N/A 06/24/2013    Procedure: TRANSESOPHAGEAL ECHOCARDIOGRAM (TEE);  Surgeon: Vesta Mixer, MD;  Location: [facility] ENDOSCOPY;  Service: Cardiovascular;  Laterality: N/A;    Current Outpatient Prescriptions  Medication Sig Dispense Refill  . amiodarone (PACERONE) 200 MG tablet Take 1 tablet (200 mg total) by mouth 2 (two) times daily.      Marland Kitchen b complex vitamins tablet Take 1 tablet by mouth every morning.       . Calcium Carbonate-Vitamin D (CALCIUM-VITAMIN D) 500-200 MG-UNIT per tablet Take 1 tablet by mouth every morning.       . furosemide (LASIX) 40 MG tablet Take 40 mg by mouth daily.      . metoprolol  succinate (TOPROL-XL) 25 MG 24 hr tablet Take 50 mg by mouth every morning.       . Multiple Vitamins-Minerals (ICAPS PO) Take 1 tablet by mouth every morning.       . pantoprazole (PROTONIX) 40 MG tablet Take 1 tablet (40 mg total) by mouth daily. Take for 6 weeks total  30 tablet  1  . Rivaroxaban (XARELTO) 15 MG TABS tablet Take 15 mg by mouth daily after supper.        No current facility-administered medications for this visit.    Allergies  Allergen Reactions  . Penicillins Itching and Swelling    History   Social History  . Marital Status: Widowed    Spouse Name: N/A    Number of Children: N/A  . Years of Education: N/A   Occupational History  . Not on file.   Social History Main Topics  . Smoking status: Never Smoker   . Smokeless tobacco: Not on file  . Alcohol Use: No  . Drug Use: No  . Sexual Activity: Not on file   Other Topics Concern  . Not on file   Social History Narrative   Lives in [place] alone.  Retired.    Family History  Problem Relation Age of Onset  . Cancer Father   . Cancer Mother   . Cancer Brother   . Cancer  Sister     Physical Exam: Filed Vitals:   07/21/13 1047  BP: 131/86  Pulse: 102  Height: 5' 3.5" (1.613 m)  Weight: 135 lb 8 oz (61.462 kg)    GEN- The patient is elderly appearing, alert and oriented x 3 today.  NAD Head- normocephalic, atraumatic Eyes-  Sclera clear, conjunctiva pink Ears- hearing intact Oropharynx- clear Neck- supple  Lungs- Clear to ausculation bilaterally, normal work of breathing Heart- tachycardic irregular rhythm, + pericardial friction rub GI- soft, NT, ND, + BS Extremities- no clubbing, cyanosis, or edema, groin ecchymosis is improving Neuro- strength and sensation are intact  ekg today reveals atypical atrial flutter, V rate 96 bpm, nonspecific ST/ T changes CXR reveals small to moderate L pleural effusion, no pneumonia  Assessment and Plan:  1. afib ERAF post ablation Continue  amiodarone, will proceed with cardioversion this am.  She is appropriately anticoagulated with xarelto.  2. SOB Pleural effusion is stable Likely pericardial/ pleural inflammation post ablation Continue lasix 40mg  daily  Return in 2 weeks for follow-up with me

## 2013-07-22 ENCOUNTER — Encounter (HOSPITAL_COMMUNITY): Payer: Self-pay | Admitting: Cardiology

## 2013-07-22 ENCOUNTER — Other Ambulatory Visit: Payer: Self-pay | Admitting: Internal Medicine

## 2013-07-29 ENCOUNTER — Telehealth: Payer: Self-pay | Admitting: *Deleted

## 2013-07-29 NOTE — Telephone Encounter (Signed)
Staff at [facility] unsure if pt needs to be premedicated post ablation/ hx afib a flutter, advised no she does not.

## 2013-08-04 ENCOUNTER — Ambulatory Visit (INDEPENDENT_AMBULATORY_CARE_PROVIDER_SITE_OTHER): Payer: Medicare Other | Admitting: Internal Medicine

## 2013-08-04 ENCOUNTER — Encounter: Payer: Self-pay | Admitting: Internal Medicine

## 2013-08-04 VITALS — BP 146/80 | HR 56 | Ht 63.5 in | Wt 131.8 lb

## 2013-08-04 DIAGNOSIS — I4891 Unspecified atrial fibrillation: Secondary | ICD-10-CM

## 2013-08-04 DIAGNOSIS — R0602 Shortness of breath: Secondary | ICD-10-CM

## 2013-08-04 LAB — BASIC METABOLIC PANEL
BUN: 16 mg/dL (ref 6–23)
Calcium: 9.7 mg/dL (ref 8.4–10.5)
Chloride: 104 mEq/L (ref 96–112)
Creatinine, Ser: 1.1 mg/dL (ref 0.4–1.2)

## 2013-08-04 MED ORDER — FUROSEMIDE 40 MG PO TABS: ORAL_TABLET | ORAL | Status: DC

## 2013-08-04 MED ORDER — AMIODARONE HCL 200 MG PO TABS: 200.0000 mg | ORAL_TABLET | Freq: Every day | ORAL | Status: DC

## 2013-08-04 NOTE — Patient Instructions (Signed)
Your physician recommends that you schedule a follow-up appointment as scheduled  Your physician has recommended you make the following change in your medication:  1) decrease Amiodarone to 200mg  daily 2) decreasae Furosemide to 40mg  evey other day   Your physician recommends that you return for lab work today BMP

## 2013-08-04 NOTE — Progress Notes (Signed)
The patient presents today for electrophysiology followup.  Her SOB and cough are 90% resolved.  She is pleased that she is making progress.  She reports nausea and decreased appetite at times.  Today, she denies symptoms of palpitations, chest pain,  lower extremity edema, dizziness, presyncope, syncope, or neurologic sequela.  The patient feels that she is tolerating medications without difficulties and is otherwise without complaint today.   Past Medical History  Diagnosis Date  . Diverticulosis   . Fibrocystic breast disease   . DJD (degenerative joint disease), multiple sites   . Osteopenia   . Macular pucker, left eye   . Sensory hearing loss, bilateral   . Presbycusis   . Colon polyp   . Persistent atrial fibrillation     s/p prior cardioversion  . Atrial flutter    Past Surgical History  Procedure Laterality Date  . Abdominal hysterectomy      with BSO  . Appendectomy    . Rotator cuff repair    . Eye srugery      macular pucker  . Cataract extraction, bilateral    . Cardioversion  09/18/2012    Procedure: CARDIOVERSION;  Surgeon: Quintella Reichert, MD;  Location: [facility] ENDOSCOPY;  Service: Cardiovascular;  Laterality: N/A;  . Cardioversion N/A 01/05/2013    Procedure: CARDIOVERSION;  Surgeon: Quintella Reichert, MD;  Location: [facility] ENDOSCOPY;  Service: Cardiovascular;  Laterality: N/A;  . Tonsillectomy    . Tee without cardioversion N/A 06/24/2013    Procedure: TRANSESOPHAGEAL ECHOCARDIOGRAM (TEE);  Surgeon: Vesta Mixer, MD;  Location: [facility] ENDOSCOPY;  Service: Cardiovascular;  Laterality: N/A;  . Cardioversion N/A 07/21/2013    Procedure: CARDIOVERSION;  Surgeon: Luis Abed, MD;  Location: [facility] ENDOSCOPY;  Service: Cardiovascular;  Laterality: N/A;    Current Outpatient Prescriptions  Medication Sig Dispense Refill  . amiodarone (PACERONE) 200 MG tablet Take 1 tablet (200 mg total) by mouth 2 (two) times daily.      Marland Kitchen b complex vitamins tablet Take 1 tablet by mouth every  morning.       . Calcium Carbonate-Vitamin D (CALCIUM-VITAMIN D) 500-200 MG-UNIT per tablet Take 1 tablet by mouth every morning.       . furosemide (LASIX) 40 MG tablet Take 1 tablet (40 mg total) by mouth daily.  30 tablet  0  . metoprolol succinate (TOPROL-XL) 25 MG 24 hr tablet Take 50 mg by mouth every morning.       . Multiple Vitamins-Minerals (ICAPS PO) Take 1 tablet by mouth every morning.       . pantoprazole (PROTONIX) 40 MG tablet Take 40 mg by mouth daily. Take for 6 weeks total (Ends 07/07/13)      . Rivaroxaban (XARELTO) 15 MG TABS tablet Take 15 mg by mouth daily after supper.        No current facility-administered medications for this visit.    Allergies  Allergen Reactions  . Penicillins Itching and Swelling    History   Social History  . Marital Status: Widowed    Spouse Name: N/A    Number of Children: N/A  . Years of Education: N/A   Occupational History  . Not on file.   Social History Main Topics  . Smoking status: Never Smoker   . Smokeless tobacco: Not on file  . Alcohol Use: No  . Drug Use: No  . Sexual Activity: Not on file   Other Topics Concern  . Not on file   Social History Narrative  Lives in [place] alone.  Retired.    Family History  Problem Relation Age of Onset  . Cancer Father   . Cancer Mother   . Cancer Brother   . Cancer Sister     Physical Exam: Filed Vitals:   08/04/13 1014  BP: 146/80  Pulse: 56  Height: 5' 3.5" (1.613 m)  Weight: 131 lb 12.8 oz (59.784 kg)    GEN- The patient is elderly appearing, alert and oriented x 3 today.  NAD Head- normocephalic, atraumatic Eyes-  Sclera clear, conjunctiva pink Ears- hearing intact Oropharynx- clear Neck- supple  Lungs- Clear to ausculation bilaterally, normal work of breathing Heart- RRR, no m/r/g GI- soft, NT, ND, + BS Extremities- no clubbing, cyanosis, or edema  Neuro- strength and sensation are intact  ekg today reveals sinus rhythm 56 bpm, PR 200 msec, Qt  is prolonged.  There is a prominent U wave  Assessment and Plan:  1. afib Remains in sinus Decrease amiodarone to 200mg  daily She is appropriately anticoagulated with xarelto.  2. SOB Clinically much improved Decrease lasix to 40mg  QOD bmet today  Return in 6 weeks for follow-up with me

## 2013-08-05 ENCOUNTER — Other Ambulatory Visit: Payer: Self-pay

## 2013-08-17 ENCOUNTER — Telehealth: Payer: Self-pay | Admitting: Internal Medicine

## 2013-08-17 NOTE — Telephone Encounter (Signed)
Called back and gave lab results

## 2013-08-17 NOTE — Telephone Encounter (Signed)
New message     Returned a nurses call.  Do not know who called or why they called.

## 2013-09-17 ENCOUNTER — Encounter: Payer: Self-pay | Admitting: Internal Medicine

## 2013-09-17 ENCOUNTER — Ambulatory Visit (INDEPENDENT_AMBULATORY_CARE_PROVIDER_SITE_OTHER): Payer: Medicare Other | Admitting: Internal Medicine

## 2013-09-17 VITALS — BP 164/80 | HR 52 | Ht 64.0 in | Wt 127.0 lb

## 2013-09-17 DIAGNOSIS — I4891 Unspecified atrial fibrillation: Secondary | ICD-10-CM

## 2013-09-17 LAB — BASIC METABOLIC PANEL
BUN: 20 mg/dL (ref 6–23)
Chloride: 103 mEq/L (ref 96–112)
Glucose, Bld: 96 mg/dL (ref 70–99)
Potassium: 4.7 mEq/L (ref 3.5–5.1)

## 2013-09-17 LAB — CBC WITH DIFFERENTIAL/PLATELET
Basophils Absolute: 0 10*3/uL (ref 0.0–0.1)
Basophils Relative: 0.3 % (ref 0.0–3.0)
Eosinophils Relative: 1.3 % (ref 0.0–5.0)
Lymphocytes Relative: 7.5 % — ABNORMAL LOW (ref 12.0–46.0)
Lymphs Abs: 0.6 10*3/uL — ABNORMAL LOW (ref 0.7–4.0)
Monocytes Relative: 10 % (ref 3.0–12.0)
Neutrophils Relative %: 80.9 % — ABNORMAL HIGH (ref 43.0–77.0)
Platelets: 152 10*3/uL (ref 150.0–400.0)
RBC: 4.55 Mil/uL (ref 3.87–5.11)
RDW: 15.3 % — ABNORMAL HIGH (ref 11.5–14.6)
WBC: 7.5 10*3/uL (ref 4.5–10.5)

## 2013-09-17 LAB — HEPATIC FUNCTION PANEL
ALT: 35 U/L (ref 0–35)
AST: 30 U/L (ref 0–37)
Albumin: 4.6 g/dL (ref 3.5–5.2)

## 2013-09-17 LAB — TSH: TSH: 3.8 u[IU]/mL (ref 0.35–5.50)

## 2013-09-17 MED ORDER — METOPROLOL SUCCINATE ER 25 MG PO TB24: 25.0000 mg | ORAL_TABLET | Freq: Every morning | ORAL | Status: DC

## 2013-09-17 MED ORDER — AMIODARONE HCL 200 MG PO TABS: 100.0000 mg | ORAL_TABLET | Freq: Every day | ORAL | Status: DC

## 2013-09-17 MED ORDER — FUROSEMIDE 40 MG PO TABS: ORAL_TABLET | ORAL | Status: DC

## 2013-09-17 NOTE — Patient Instructions (Addendum)
Your physician recommends that you schedule a follow-up appointment in: 3 months with Dr Johney Frame  Your physician has recommended you make the following change in your medication:  1) Take your Furosemide only as needed 2) Decrease Toprol to 25mg  daily 3) Decrease Amiodarone to 100mg  daily  Your physician recommends that you return for lab work today

## 2013-09-17 NOTE — Progress Notes (Signed)
Primary Cardiologist:  Dr Einar Crow [name] is a 79 y.o. female who presents today for routine electrophysiology followup.  Since last being seen in our clinic, the patient reports doing very well.  She is unaware of any further afib.  Her cough and SOB are completely resolved. Today, she denies symptoms of palpitations, chest pain, shortness of breath,  lower extremity edema, dizziness, presyncope, or syncope.  The patient is otherwise without complaint today.   Past Medical History  Diagnosis Date  . Diverticulosis   . Fibrocystic breast disease   . DJD (degenerative joint disease), multiple sites   . Osteopenia   . Macular pucker, left eye   . Sensory hearing loss, bilateral   . Presbycusis   . Colon polyp   . Persistent atrial fibrillation     s/p prior cardioversion  . Atrial flutter    Past Surgical History  Procedure Laterality Date  . Abdominal hysterectomy      with BSO  . Appendectomy    . Rotator cuff repair    . Eye srugery      macular pucker  . Cataract extraction, bilateral    . Cardioversion  09/18/2012    Procedure: CARDIOVERSION;  Surgeon: Quintella Reichert, MD;  Location: [facility] ENDOSCOPY;  Service: Cardiovascular;  Laterality: N/A;  . Cardioversion N/A 01/05/2013    Procedure: CARDIOVERSION;  Surgeon: Quintella Reichert, MD;  Location: [facility] ENDOSCOPY;  Service: Cardiovascular;  Laterality: N/A;  . Tonsillectomy    . Tee without cardioversion N/A 06/24/2013    Procedure: TRANSESOPHAGEAL ECHOCARDIOGRAM (TEE);  Surgeon: Vesta Mixer, MD;  Location: [facility] ENDOSCOPY;  Service: Cardiovascular;  Laterality: N/A;  . Cardioversion N/A 07/21/2013    Procedure: CARDIOVERSION;  Surgeon: Luis Abed, MD;  Location: [facility] ENDOSCOPY;  Service: Cardiovascular;  Laterality: N/A;    Current Outpatient Prescriptions  Medication Sig Dispense Refill  . amiodarone (PACERONE) 200 MG tablet Take 1 tablet (200 mg total) by mouth daily.      Marland Kitchen b complex vitamins tablet Take 1 tablet by  mouth every morning.       . Calcium Carbonate-Vitamin D (CALCIUM-VITAMIN D) 500-200 MG-UNIT per tablet Take 1 tablet by mouth every morning.       . furosemide (LASIX) 40 MG tablet Every other day  30 tablet  0  . metoprolol succinate (TOPROL-XL) 25 MG 24 hr tablet Take 50 mg by mouth every morning.       . Multiple Vitamins-Minerals (ICAPS PO) Take 1 tablet by mouth every morning.       . Rivaroxaban (XARELTO) 15 MG TABS tablet Take 15 mg by mouth daily after supper.        No current facility-administered medications for this visit.    Physical Exam: Filed Vitals:   09/17/13 1200  BP: 164/80  Pulse: 52  Height: 5\' 4"  (1.626 m)  Weight: 127 lb (57.607 kg)    GEN- The patient is well appearing, alert and oriented x 3 today.   Head- normocephalic, atraumatic Eyes-  Sclera clear, conjunctiva pink Ears- hearing intact Oropharynx- clear Lungs- Clear to ausculation bilaterally, normal work of breathing Heart- Regular rate and rhythm, no murmurs, rubs or gallops, PMI not laterally displaced GI- soft, NT, ND, + BS Extremities- no clubbing, cyanosis, or edema  ekg today reveals sinus bradycardia 52 bpm, PR 216, nonspecific St/T changes, Qtc is stable though there is a prominent U wave  Assessment and Plan:  1. afib Doing well s/p ablation Decrease  amiodarone to 100mg  daily Decrease toprol to 25mg  daily Continue xarelto (CHADS2VASC score is at least 4) Consider stopping amiodarone if no further arrhythmias upon return  2. HTN Above goal today She reports good BP control at home Stop lasix and use only as needed Salt restriction Consider ARB if further elevation in blood pressure in the future given data suggesting improved atrial remodeling with ARB drugs.  3. Bradycardia Decrease toprol  Return in 3 months

## 2013-09-29 ENCOUNTER — Encounter: Payer: Medicare Other | Admitting: Internal Medicine

## 2013-11-20 ENCOUNTER — Other Ambulatory Visit: Payer: Self-pay | Admitting: Cardiology

## 2013-11-22 ENCOUNTER — Other Ambulatory Visit: Payer: Self-pay | Admitting: General Surgery

## 2013-11-22 MED ORDER — RIVAROXABAN 15 MG PO TABS: 15.0000 mg | ORAL_TABLET | Freq: Every day | ORAL | Status: DC

## 2013-12-09 ENCOUNTER — Telehealth: Payer: Self-pay | Admitting: Internal Medicine

## 2013-12-09 NOTE — Telephone Encounter (Signed)
Will move appointment up to 12/29/13 at 12:30

## 2013-12-09 NOTE — Telephone Encounter (Signed)
New message    Need to discuss missed communication with appt date & time. Has appt on  4/22 . She not happy waiting that long to be seen or suggestion.

## 2013-12-29 ENCOUNTER — Ambulatory Visit (INDEPENDENT_AMBULATORY_CARE_PROVIDER_SITE_OTHER): Payer: Medicare Other | Admitting: Internal Medicine

## 2013-12-29 ENCOUNTER — Encounter: Payer: Self-pay | Admitting: Internal Medicine

## 2013-12-29 VITALS — BP 154/78 | HR 54 | Ht 64.0 in | Wt 125.0 lb

## 2013-12-29 DIAGNOSIS — I4891 Unspecified atrial fibrillation: Secondary | ICD-10-CM

## 2013-12-29 MED ORDER — METOPROLOL SUCCINATE ER 25 MG PO TB24: 12.5000 mg | ORAL_TABLET | Freq: Every morning | ORAL | Status: DC

## 2013-12-29 MED ORDER — LOSARTAN POTASSIUM 25 MG PO TABS: 25.0000 mg | ORAL_TABLET | Freq: Every day | ORAL | Status: DC

## 2013-12-29 NOTE — Progress Notes (Signed)
Primary Cardiologist:  Dr Mayford Knife PCP: Marden Noble MD  [name] is a 80 y.o. female who presents today for routine electrophysiology followup.  Since last being seen in our clinic, the patient reports doing very well.  She is unaware of any further afib.   Today, she denies symptoms of palpitations, chest pain, shortness of breath,  lower extremity edema, dizziness, presyncope, or syncope.  The patient is otherwise without complaint today.   Past Medical History  Diagnosis Date  . Diverticulosis   . Fibrocystic breast disease   . DJD (degenerative joint disease), multiple sites   . Osteopenia   . Macular pucker, left eye   . Sensory hearing loss, bilateral   . Presbycusis   . Colon polyp   . Persistent atrial fibrillation     s/p prior cardioversion  . Atrial flutter    Past Surgical History  Procedure Laterality Date  . Abdominal hysterectomy      with BSO  . Appendectomy    . Rotator cuff repair    . Eye srugery      macular pucker  . Cataract extraction, bilateral    . Cardioversion  09/18/2012    Procedure: CARDIOVERSION;  Surgeon: Quintella Reichert, MD;  Location: [facility] ENDOSCOPY;  Service: Cardiovascular;  Laterality: N/A;  . Cardioversion N/A 01/05/2013    Procedure: CARDIOVERSION;  Surgeon: Quintella Reichert, MD;  Location: [facility] ENDOSCOPY;  Service: Cardiovascular;  Laterality: N/A;  . Tonsillectomy    . Tee without cardioversion N/A 06/24/2013    Procedure: TRANSESOPHAGEAL ECHOCARDIOGRAM (TEE);  Surgeon: Vesta Mixer, MD;  Location: [facility] ENDOSCOPY;  Service: Cardiovascular;  Laterality: N/A;  . Cardioversion N/A 07/21/2013    Procedure: CARDIOVERSION;  Surgeon: Luis Abed, MD;  Location: [facility] ENDOSCOPY;  Service: Cardiovascular;  Laterality: N/A;    Current Outpatient Prescriptions  Medication Sig Dispense Refill  . amiodarone (PACERONE) 200 MG tablet Take 0.5 tablets (100 mg total) by mouth daily.  45 tablet  3  . b complex vitamins tablet Take 1 tablet by mouth  every morning.       . Calcium Carbonate-Vitamin D (CALCIUM-VITAMIN D) 500-200 MG-UNIT per tablet Take 1 tablet by mouth every morning.       . furosemide (LASIX) 40 MG tablet Take 40 mg by mouth as needed.      . metoprolol succinate (TOPROL-XL) 25 MG 24 hr tablet Take 1 tablet (25 mg total) by mouth every morning.  90 tablet  3  . Multiple Vitamins-Minerals (ICAPS PO) Take 1 tablet by mouth every morning.       . Rivaroxaban (XARELTO) 15 MG TABS tablet Take 1 tablet (15 mg total) by mouth daily with supper.  30 tablet  11   No current facility-administered medications for this visit.    Physical Exam: Filed Vitals:   12/29/13 1242  BP: 154/78  Pulse: 54  Height: 5\' 4"  (1.626 m)  Weight: 125 lb (56.7 kg)    GEN- The patient is well appearing, alert and oriented x 3 today.   Head- normocephalic, atraumatic Eyes-  Sclera clear, conjunctiva pink Ears- hearing intact Oropharynx- clear Lungs- Clear to ausculation bilaterally, normal work of breathing Heart- Regular rate and rhythm, no murmurs, rubs or gallops, PMI not laterally displaced GI- soft, NT, ND, + BS Extremities- no clubbing, cyanosis, or edema  ekg today reveals sinus bradycardia 54bpm, PR 212, nonspecific St/T changes   Assessment and Plan:  1. afib Doing well s/p ablation No afib since  last visit Stop amiodarone at this time Decrease toprol to 12.5mg  daily Continue xarelto (CHADS2VASC score is at least 4) Consider stopping toprol upon return  2. HTN Above goal today Add losartan 25mg  daily bmet in 4 weeks  3. Bradycardia Decrease toprol as above  Return in 6 months

## 2013-12-29 NOTE — Patient Instructions (Signed)
Your physician wants you to follow-up in: 6 months with Dr Jacquiline DoeAllred You will receive a reminder letter in the mail two months in advance. If you don't receive a letter, please call our office to schedule the follow-up appointment.  Your physician recommends that you return for lab work in: 4 weeks BMP  Your physician has recommended you make the following change in your medication:  1) Stop Amiodarone 2) Start Losartan 25mg  daily 3) Decrease Toprol to 12.5mg  daily

## 2014-01-19 ENCOUNTER — Ambulatory Visit: Payer: Medicare Other | Admitting: Internal Medicine

## 2014-01-28 ENCOUNTER — Other Ambulatory Visit (INDEPENDENT_AMBULATORY_CARE_PROVIDER_SITE_OTHER): Payer: Medicare Other

## 2014-01-28 DIAGNOSIS — I4891 Unspecified atrial fibrillation: Secondary | ICD-10-CM

## 2014-01-28 LAB — BASIC METABOLIC PANEL
BUN: 20 mg/dL (ref 6–23)
CALCIUM: 9.5 mg/dL (ref 8.4–10.5)
CO2: 27 mEq/L (ref 19–32)
CREATININE: 1.1 mg/dL (ref 0.4–1.2)
Chloride: 103 mEq/L (ref 96–112)
GFR: 51.8 mL/min — ABNORMAL LOW (ref >60.00)
Glucose, Bld: 79 mg/dL (ref 70–99)
Potassium: 3.7 mEq/L (ref 3.5–5.1)
Sodium: 139 mEq/L (ref 135–145)

## 2014-01-31 ENCOUNTER — Telehealth: Payer: Self-pay | Admitting: Cardiology

## 2014-01-31 NOTE — Telephone Encounter (Signed)
Lm to call back

## 2014-01-31 NOTE — Telephone Encounter (Signed)
Patient started taking [place], Below are a listing of her BP reading:   01/28/14   AM          84/53             NOON    107/73             PM          114/76  01/29/14   AM          116/68             NOON     114/60             PM           152/67  01/30/14  AM         137/70            Noon       126/62            PM           120/54  Please advise.

## 2014-02-02 NOTE — Telephone Encounter (Signed)
BP is stable.continue current meds  

## 2014-02-02 NOTE — Telephone Encounter (Signed)
TO Dr Turner to review 

## 2014-02-03 NOTE — Telephone Encounter (Signed)
LVM for pt to return call

## 2014-02-04 ENCOUNTER — Encounter: Payer: Self-pay | Admitting: General Surgery

## 2014-02-04 NOTE — Telephone Encounter (Signed)
Letter sent to pt to make aware.  

## 2014-03-16 ENCOUNTER — Other Ambulatory Visit (HOSPITAL_COMMUNITY): Payer: Self-pay | Admitting: Internal Medicine

## 2014-03-16 DIAGNOSIS — Z1231 Encounter for screening mammogram for malignant neoplasm of breast: Secondary | ICD-10-CM

## 2014-04-04 ENCOUNTER — Ambulatory Visit (HOSPITAL_COMMUNITY)
Admission: RE | Admit: 2014-04-04 | Discharge: 2014-04-04 | Disposition: A | Discharge status: Home / Self Care | Payer: Medicare Other | Source: Ambulatory Visit | Attending: Internal Medicine | Admitting: Internal Medicine

## 2014-04-04 DIAGNOSIS — Z1231 Encounter for screening mammogram for malignant neoplasm of breast: Secondary | ICD-10-CM

## 2014-04-08 ENCOUNTER — Encounter: Payer: Self-pay | Admitting: *Deleted

## 2014-05-31 ENCOUNTER — Ambulatory Visit (INDEPENDENT_AMBULATORY_CARE_PROVIDER_SITE_OTHER): Payer: Medicare Other | Admitting: Cardiology

## 2014-05-31 ENCOUNTER — Encounter: Payer: Self-pay | Admitting: Cardiology

## 2014-05-31 VITALS — BP 140/74 | HR 73 | Ht 64.0 in | Wt 124.2 lb

## 2014-05-31 DIAGNOSIS — I10 Essential (primary) hypertension: Secondary | ICD-10-CM

## 2014-05-31 DIAGNOSIS — I4891 Unspecified atrial fibrillation: Secondary | ICD-10-CM

## 2014-05-31 DIAGNOSIS — I480 Paroxysmal atrial fibrillation: Secondary | ICD-10-CM

## 2014-05-31 LAB — BASIC METABOLIC PANEL
BUN: 16 mg/dL (ref 6–23)
CHLORIDE: 104 meq/L (ref 96–112)
CO2: 30 mEq/L (ref 19–32)
Calcium: 9.6 mg/dL (ref 8.4–10.5)
Creatinine, Ser: 0.9 mg/dL (ref 0.4–1.2)
GFR: 65.55 mL/min (ref >60.00)
Glucose, Bld: 94 mg/dL (ref 70–99)
Potassium: 4 mEq/L (ref 3.5–5.1)
Sodium: 140 mEq/L (ref 135–145)

## 2014-05-31 LAB — CBC
HCT: 38.5 % (ref 36.0–46.0)
HEMOGLOBIN: 12.9 g/dL (ref 12.0–15.0)
MCHC: 33.6 g/dL (ref 30.0–36.0)
MCV: 94.9 fl (ref 78.0–100.0)
Platelets: 140 10*3/uL — ABNORMAL LOW (ref 150.0–400.0)
RBC: 4.05 Mil/uL (ref 3.87–5.11)
RDW: 13.2 % (ref 11.5–15.5)
WBC: 4.9 10*3/uL (ref 4.0–10.5)

## 2014-05-31 MED ORDER — LOSARTAN POTASSIUM 25 MG PO TABS: 12.5000 mg | ORAL_TABLET | Freq: Every day | ORAL | Status: DC

## 2014-05-31 MED ORDER — RIVAROXABAN 15 MG PO TABS: 15.0000 mg | ORAL_TABLET | Freq: Every day | ORAL | Status: DC

## 2014-05-31 NOTE — Patient Instructions (Signed)
Your physician recommends that you continue on your current medications as directed. Please refer to the Current Medication list given to you today.  Your refills have been sent into your pharmacy for you with a 90 day supply with 3 refills  Your physician has recommended that you wear an event monitor. Event monitors are medical devices that record the heart's electrical activity. Doctors most often [place] these monitors to diagnose arrhythmias. Arrhythmias are problems with the speed or rhythm of the heartbeat. The monitor is a small, portable device. You can wear one while you do your normal daily activities. This is usually used to diagnose what is causing palpitations/syncope (passing out).  Your physician recommends that you have a BMET and CBC done  Your physician wants you to follow-up in: 6 months with Dr Sherlyn Lick will receive a reminder letter in the mail two months in advance. If you don't receive a letter, please call our office to schedule the follow-up appointment.

## 2014-05-31 NOTE — Progress Notes (Signed)
[address] [place], [place]  [address] Phone: [phone] Fax:  [phone]  Date:  05/31/2014   ID:  [name], DOB 06/10/1931, MRN [id number]  PCP:  PROVIDER NOT IN SYSTEM  Cardiologist:  Armanda Magic, MD     History of Present Illness:  This is an 80yo female with a history of paroxysmal atrial fibrillation s/p afib ablation and chronic systemic anticoagulation who presents today for followup.  She is doing well.  She denies any chest pain, SOB, DOE, LE edema, dizziness or syncope.  For the past few months she has noticed some palpitations that she says last about 10 minutes and resolve.  She says she will feel her heart thump and a funny sensation in her throat and a headache.   Wt Readings from Last 3 Encounters:  05/31/14 124 lb 3.2 oz (56.337 kg)  12/29/13 125 lb (56.7 kg)  09/17/13 127 lb (57.607 kg)     Past Medical History  Diagnosis Date  . Diverticulosis   . Fibrocystic breast disease   . DJD (degenerative joint disease), multiple sites   . Osteopenia   . Macular pucker, left eye   . Sensory hearing loss, bilateral   . Presbycusis   . Colon polyp   . Persistent atrial fibrillation     s/p prior cardioversion  . Atrial flutter     Current Outpatient Prescriptions  Medication Sig Dispense Refill  . b complex vitamins tablet Take 1 tablet by mouth every morning.       . Calcium Carbonate-Vitamin D (CALCIUM-VITAMIN D) 500-200 MG-UNIT per tablet Take 1 tablet by mouth every morning.       Marland Kitchen losartan (COZAAR) 25 MG tablet Take 12.5 mg by mouth daily.      . metoprolol succinate (TOPROL-XL) 25 MG 24 hr tablet Take 0.5 tablets (12.5 mg total) by mouth every morning.  90 tablet  3  . Multiple Vitamins-Minerals (ICAPS PO) Take 1 tablet by mouth every morning.       . Rivaroxaban (XARELTO) 15 MG TABS tablet Take 1 tablet (15 mg total) by mouth daily with supper.  30 tablet  11   No current facility-administered medications for this visit.     Allergies:    Allergies  Allergen Reactions  . Penicillins Itching and Swelling    Social History:  The patient  reports that she has never smoked. She does not have any smokeless tobacco history on file. She reports that she does not drink alcohol or use illicit drugs.   Family History:  The patient's family history includes Cancer in her brother and mother; Colon cancer in her father; Heart Problems in her sister.   ROS:  Please see the history of present illness.      All other systems reviewed and negative.   PHYSICAL EXAM: VS:  BP 140/74  Pulse 73  Ht  (1.626 m)  Wt 124 lb 3.2 oz (56.337 kg)  BMI 21.31 kg/m2 Well nourished, well developed, in no acute distress HEENT: normal Neck: no JVD Cardiac:  normal S1, S2; RRR; no murmur Lungs:  clear to auscultation bilaterally, no wheezing, rhonchi or rales Abd: soft, nontender, no hepatomegaly Ext: no edema Skin: warm and dry Neuro:  CNs 2-12 intact, no focal abnormalities noted  EKG:  NSR with sinus arrhythmia with nonspecific ST abnormality   ASSESSMENT AND PLAN:  1. PAF maintaining NSR s/p afib ablation - continue Toprol - 30 day event monitor to assess  for PAF given recent problems with palpitations - check NOAC 2. Chronic systemic anticoagulation 3. HTN well controlled - continue Losartan and Toprol - BMET today  Followup with me in 1 year  Signed, Armanda Magic, MD 05/31/2014 10:21 AM

## 2014-06-01 ENCOUNTER — Encounter (INDEPENDENT_AMBULATORY_CARE_PROVIDER_SITE_OTHER): Payer: Medicare Other

## 2014-06-01 ENCOUNTER — Encounter: Payer: Self-pay | Admitting: General Surgery

## 2014-06-01 ENCOUNTER — Encounter: Payer: Self-pay | Admitting: *Deleted

## 2014-06-01 DIAGNOSIS — I4891 Unspecified atrial fibrillation: Secondary | ICD-10-CM

## 2014-06-01 DIAGNOSIS — I480 Paroxysmal atrial fibrillation: Secondary | ICD-10-CM

## 2014-06-01 NOTE — Progress Notes (Signed)
Patient ID: [name], female   DOB: Feb 14, 1931, 80 y.o.   MRN: [id number] [employer] 30 day cardiac event monitor applied to patient.  [employer] contacted to mail patient electrodes for sensitive skin.  Patient given four day supply of electrodes for sensitive skin.

## 2014-06-09 ENCOUNTER — Telehealth: Payer: Self-pay | Admitting: Internal Medicine

## 2014-06-09 MED ORDER — METOPROLOL SUCCINATE ER 25 MG PO TB24: 25.0000 mg | ORAL_TABLET | Freq: Every morning | ORAL | Status: DC

## 2014-06-09 NOTE — Telephone Encounter (Signed)
As an on-call cardiology M.D., I received a call from [employer] about [name]. She reportedly had been rate-controlled atrial fibrillation this evening. Reviewing her chart, patient has known history of paroxysmal nature fibrillation and is on Xarelto for stroke prophylaxis.

## 2014-06-09 NOTE — Addendum Note (Signed)
Addended by: Nita Sells on: 06/09/2014 05:08 PM   Modules accepted: Orders

## 2014-06-09 NOTE — Telephone Encounter (Signed)
Pt is aware. Med list updated for pt. 

## 2014-06-09 NOTE — Telephone Encounter (Signed)
Rhythm strips reviewed.  She was in atrial fibrillation with RVR up to 127bpm.  Now back in NSR.  Please have her increase her Toprol to  1 tablet daily.  Will continue to monitor

## 2014-06-13 ENCOUNTER — Telehealth: Payer: Self-pay | Admitting: *Deleted

## 2014-06-13 NOTE — Telephone Encounter (Signed)
New problem:[name] said she received a call stating she was in Atrial fib,she would like to know if she can take her monitor off.[phone].

## 2014-06-13 NOTE — Telephone Encounter (Signed)
Explained to pt we need her to keep monitor on the full 30 days. Pt aware and will continue to wear monitor.

## 2014-06-17 ENCOUNTER — Encounter: Payer: Self-pay | Admitting: Cardiology

## 2014-06-22 ENCOUNTER — Encounter: Payer: Self-pay | Admitting: Internal Medicine

## 2014-06-22 ENCOUNTER — Ambulatory Visit (INDEPENDENT_AMBULATORY_CARE_PROVIDER_SITE_OTHER): Payer: Medicare Other | Admitting: Internal Medicine

## 2014-06-22 VITALS — BP 148/80 | HR 67 | Ht 64.0 in | Wt 125.6 lb

## 2014-06-22 DIAGNOSIS — I483 Typical atrial flutter: Secondary | ICD-10-CM

## 2014-06-22 DIAGNOSIS — I4892 Unspecified atrial flutter: Secondary | ICD-10-CM

## 2014-06-22 DIAGNOSIS — I4891 Unspecified atrial fibrillation: Secondary | ICD-10-CM

## 2014-06-22 DIAGNOSIS — I4819 Other persistent atrial fibrillation: Secondary | ICD-10-CM

## 2014-06-22 DIAGNOSIS — I10 Essential (primary) hypertension: Secondary | ICD-10-CM

## 2014-06-22 NOTE — Progress Notes (Signed)
Primary Cardiologist:  Dr Mayford Knife PCP: Marden Noble MD  [name] is a 80 y.o. female who presents today for routine electrophysiology followup.  Since last being seen in our clinic, the patient reports some recent brief palpitations. Amiodarone was d/c on last visit.  She is currently wearing a 30 day monitor placed by Dr. Mayford Knife. Pt had an episode of afib per monitor 9/10 and Toprol was increased to 25 mg a day. She has seen improvement in palpitations since then. Review of monitor strips reveal brief episodes atrial tachycardia, atrial flutter and atrial fib. She is going on vacation soon and since she is now having very few palps with increase of bb, will d/c monitor.  Past Medical History  Diagnosis Date  . Diverticulosis   . Fibrocystic breast disease   . DJD (degenerative joint disease), multiple sites   . Osteopenia   . Macular pucker, left eye   . Sensory hearing loss, bilateral   . Presbycusis   . Colon polyp   . Persistent atrial fibrillation     s/p prior cardioversion  . Atrial flutter    Past Surgical History  Procedure Laterality Date  . Abdominal hysterectomy      with BSO  . Appendectomy    . Rotator cuff repair    . Eye srugery      macular pucker  . Cataract extraction, bilateral    . Cardioversion  09/18/2012    Procedure: CARDIOVERSION;  Surgeon: Quintella Reichert, MD;  Location: [facility] ENDOSCOPY;  Service: Cardiovascular;  Laterality: N/A;  . Cardioversion N/A 01/05/2013    Procedure: CARDIOVERSION;  Surgeon: Quintella Reichert, MD;  Location: [facility] ENDOSCOPY;  Service: Cardiovascular;  Laterality: N/A;  . Tonsillectomy    . Tee without cardioversion N/A 06/24/2013    Procedure: TRANSESOPHAGEAL ECHOCARDIOGRAM (TEE);  Surgeon: Vesta Mixer, MD;  Location: [facility] ENDOSCOPY;  Service: Cardiovascular;  Laterality: N/A;  . Cardioversion N/A 07/21/2013    Procedure: CARDIOVERSION;  Surgeon: Luis Abed, MD;  Location: [facility] ENDOSCOPY;  Service: Cardiovascular;  Laterality:  N/A;    Current Outpatient Prescriptions  Medication Sig Dispense Refill  . b complex vitamins tablet Take 1 tablet by mouth every morning.       . Calcium Carbonate-Vitamin D (CALCIUM-VITAMIN D) 500-200 MG-UNIT per tablet Take 1 tablet by mouth every morning.       Marland Kitchen losartan (COZAAR) 25 MG tablet Take 0.5 tablets (12.5 mg total) by mouth daily.  45 tablet  3  . metoprolol succinate (TOPROL-XL) 25 MG 24 hr tablet Take 1 tablet (25 mg total) by mouth every morning.  90 tablet  3  . Multiple Vitamins-Minerals (ICAPS PO) Take 1 tablet by mouth every morning.       . Rivaroxaban (XARELTO) 15 MG TABS tablet Take 1 tablet (15 mg total) by mouth daily with supper.  90 tablet  3   No current facility-administered medications for this visit.    Physical Exam: Filed Vitals:   06/22/14 1501  BP: 148/80  Pulse: 67  Height:  (1.626 m)  Weight: 125 lb 9.6 oz (56.972 kg)    GEN- The patient is well appearing, alert and oriented x 3 today.   Head- normocephalic, atraumatic Eyes-  Sclera clear, conjunctiva pink Ears- hearing intact Oropharynx- clear Lungs- Clear to ausculation bilaterally, normal work of breathing Heart- Regular rate and rhythm, no murmurs, rubs or gallops, PMI not laterally displaced GI- soft, NT, ND, + BS Extremities- no clubbing, cyanosis, or  edema  Life watch monitor strips reviewed per HPI.  Assessment and Plan:  1.Paroxysmal afib/ atrial tachycardia/ atrial flutter Reports recent brief episodes improved with increase of toprol to 25 mg a day. Continue same and stop monitor (she wants it taken off). Continue xarelto. She does not feel that symptoms warrant additional evaluation/ management at this time  2. HTN Stable today with 25 mg losartan.  3. Return in 6 months Follow-up with Dr Mayford Knife as scheduled

## 2014-06-22 NOTE — Patient Instructions (Signed)
Your physician wants you to follow-up in: 6 months with Dr. Jacquiline Doe will receive a reminder letter in the mail two months in advance. If you don't receive a letter, please call our office to schedule the follow-up appointment.   Thank you for choosing [facility]!!     Dennis Bast, RN [phone]

## 2014-07-04 ENCOUNTER — Encounter: Payer: Self-pay | Admitting: Cardiology

## 2014-07-04 ENCOUNTER — Telehealth: Payer: Self-pay | Admitting: Cardiology

## 2014-07-04 NOTE — Telephone Encounter (Signed)
lmtrc

## 2014-07-04 NOTE — Telephone Encounter (Signed)
Please let patient know that she only had 1 additional episode of PAF after increasing Toprol on heart monitor.  Please find out if palpitations have settled down

## 2014-07-04 NOTE — Telephone Encounter (Signed)
This encounter was created in error - please disregard.

## 2014-07-07 ENCOUNTER — Telehealth: Payer: Self-pay | Admitting: Cardiology

## 2014-07-07 NOTE — Telephone Encounter (Signed)
New problem ° ° °Pt returning a call from nurse. Please call pt. °

## 2014-07-07 NOTE — Telephone Encounter (Addendum)
Spoke with pt and she will have occasional palpitations in the mornings maybe twice a week.  Pt states she can feel her heart beat faster than normal (unsure of HR). Pt will feel slight dizziness/ lightheadedness and  "constriction" in her throat and she will have to sit down. She feels that if she doesn't sit down and rest she may pass out. The episodes last maybe 10 minutes.

## 2014-07-07 NOTE — Telephone Encounter (Signed)
See other telephone note.  

## 2014-07-07 NOTE — Telephone Encounter (Signed)
[name] at 07/07/2014 4:18 PM     Status: Signed        New problem  Pt returning a call from nurse. Please call pt.

## 2014-07-07 NOTE — Telephone Encounter (Signed)
lmtrc

## 2014-07-12 NOTE — Telephone Encounter (Signed)
How much Toprol is she taking currently and what has her BP and HR been running at baseline

## 2014-07-13 MED ORDER — METOPROLOL SUCCINATE ER 50 MG PO TB24: 50.0000 mg | ORAL_TABLET | Freq: Every morning | ORAL | Status: DC

## 2014-07-13 NOTE — Telephone Encounter (Signed)
Pt took BP reading while on the phone with me and it was 142/78 with a  Pulse of 70   To Dr Mayford Knifeurner to advise.

## 2014-07-13 NOTE — Telephone Encounter (Signed)
Increase toprol to 50mg  daily and have patient call in a week to let us know if palpitations have settled down

## 2014-07-13 NOTE — Telephone Encounter (Signed)
Pt is aware. Med changed and new Rx sent into pts new pharmacy as requested.

## 2014-07-13 NOTE — Addendum Note (Signed)
Addended by: Nita SellsORSON, Zamari Vea L on: 07/13/2014 09:04 AM   Modules accepted: Orders

## 2014-07-13 NOTE — Telephone Encounter (Signed)
Please have her check her BP and HR today and call with results and then I will determined if ok to increase toprol

## 2014-07-13 NOTE — Telephone Encounter (Signed)
Pt states she has been taking Toprol 25 MG 1 tablet daily. She has not been checking her BP or HR daily. I asked her to go ahead and start that. I will ask Dr Mayford Knifeurner if she wants her to check for a week and call us with the results. TO Dr Mayford Knifeurner to advise.

## 2014-09-08 ENCOUNTER — Encounter (HOSPITAL_COMMUNITY): Payer: Self-pay | Admitting: Internal Medicine

## 2014-12-04 NOTE — Progress Notes (Signed)
Cardiology Office Note   Date:  12/05/2014   ID:  Kem BoroughsDorothy M Cannon, DOB 01/26/1931, MRN [id number]  PCP:  PROVIDER NOT IN SYSTEM  Cardiologist:   Quintella ReichertURNER,TRACI R, MD   Chief Complaint  Patient presents with  . Atrial Fibrillation  . Hypertension      History of Present Illness: This is an 81yo female with a history of paroxysmal atrial fibrillation s/p afib ablation and chronic systemic anticoagulation who presents today for followup. She is doing well. She denies any chest pain, SOB, DOE, dizziness or syncope. She says that she does get an irregular heart beat intermittently.  She says that it feels like what it used to feel like with the PAF.  She has occasional LE edema that is stable.    Past Medical History  Diagnosis Date  . Diverticulosis   . Fibrocystic breast disease   . DJD (degenerative joint disease), multiple sites   . Osteopenia   . Macular pucker, left eye   . Sensory hearing loss, bilateral   . Presbycusis   . Colon polyp   . Persistent atrial fibrillation     s/p prior cardioversion  . Atrial flutter     Past Surgical History  Procedure Laterality Date  . Abdominal hysterectomy      with BSO  . Appendectomy    . Rotator cuff repair    . Eye srugery      macular pucker  . Cataract extraction, bilateral    . Cardioversion  09/18/2012    Procedure: CARDIOVERSION;  Surgeon: Quintella Reichertraci R Turner, MD;  [facility];  Service: Cardiovascular;  Laterality: N/A;  . Cardioversion N/A 01/05/2013    Procedure: CARDIOVERSION;  Surgeon: Quintella Reichertraci R Turner, MD;  Location: [facility] ENDOSCOPY;  Service: Cardiovascular;  Laterality: N/A;  . Tonsillectomy    . Tee without cardioversion N/A 06/24/2013    Procedure: TRANSESOPHAGEAL ECHOCARDIOGRAM (TEE);  Surgeon: Vesta MixerPhilip J Nahser, MD;  [facility];  Service: Cardiovascular;  Laterality: N/A;  . Cardioversion N/A 07/21/2013    Procedure: CARDIOVERSION;  Surgeon: Luis AbedJeffrey D Katz, MD;  [facility];   Service: Cardiovascular;  Laterality: N/A;  . Atrial fibrillation ablation N/A 06/25/2013    Procedure: ATRIAL FIBRILLATION ABLATION;  Surgeon: Gardiner RhymeJames D [name], MD;  [facility];  Service: Cardiovascular;  Laterality: N/A;     Current Outpatient Prescriptions  Medication Sig Dispense Refill  . b complex vitamins tablet Take 1 tablet by mouth every morning.     . Calcium Carbonate-Vitamin D (CALCIUM-VITAMIN D) 500-200 MG-UNIT per tablet Take 1 tablet by mouth every morning.     Marland Kitchen. losartan (COZAAR) 25 MG tablet Take 0.5 tablets (12.5 mg total) by mouth daily. 45 tablet 3  . metoprolol succinate (TOPROL-XL) 50 MG 24 hr tablet Take 1 tablet (50 mg total) by mouth every morning. 90 tablet 3  . Multiple Vitamins-Minerals (ICAPS PO) Take 1 tablet by mouth every morning.     . Rivaroxaban (XARELTO) 15 MG TABS tablet Take 1 tablet (15 mg total) by mouth daily with supper. 90 tablet 3   No current facility-administered medications for this visit.    Allergies:   Penicillins    Social History:  The patient  reports that she has never smoked. She does not have any smokeless tobacco history on file. She reports that she does not drink alcohol or use illicit drugs.   Family History:  The patient's family history includes Cancer in her brother and mother; Colon  cancer in her father; Heart Problems in her sister.    ROS:  Please see the history of present illness.   Otherwise, review of systems are positive for none.   All other systems are reviewed and negative.    PHYSICAL EXAM: VS:  BP 100/60 mmHg  Pulse 85  Ht  (1.6 m)  Wt 129 lb 9.6 oz (58.786 kg)  BMI 22.96 kg/m2  SpO2 98% , BMI Body mass index is 22.96 kg/(m^2). GEN: Well nourished, well developed, in no acute distress HEENT: normal Neck: no JVD, carotid bruits, or masses Cardiac: RRR; no murmurs, rubs, or gallops,no edema  Respiratory:  clear to auscultation bilaterally, normal work of breathing GI: soft, nontender,  nondistended, + BS MS: no deformity or atrophy Skin: warm and dry, no rash Neuro:  Strength and sensation are intact Psych: euthymic mood, full affect   EKG:  EKG was ordered today and showed atrial flutter with variable block with HR 96bpm      Recent Labs: 05/31/2014: BUN 16; Creatinine 0.9; Hemoglobin 12.9; Platelets 140.0*; Potassium 4.0; Sodium 140    Lipid Panel No results found for: CHOL, TRIG, HDL, CHOLHDL, VLDL, LDLCALC, LDLDIRECT    Wt Readings from Last 3 Encounters:  12/05/14 129 lb 9.6 oz (58.786 kg)  06/22/14 125 lb 9.6 oz (56.972 kg)  05/31/14 124 lb 3.2 oz (56.337 kg)      ASSESSMENT AND PLAN:  1. PAF s/p afib ablation now in atrial flutter with CVR.   - continue Toprol/Xarelto - check NOAC - Her BP limits further uptitration of medication.  I am going to refer her to Dr. Johney Frame for consideration of aflutter ablation.   2. Chronic systemic anticoagulation 3. HTN well controlled and actually on the soft side.   - continue Toprol - Stop Losartan due to soft BP   4.  Palpitations - she is in atrial flutter today with rate controlled  Current medicines are reviewed at length with the patient today.  The patient does not have concerns regarding medicines.  The following changes have been made:  no change  Labs/ tests ordered today include: BMET, CBC    Disposition:   FU with me in 3 months   Signed, Quintella Reichert, MD  12/05/2014 1:41 PM    [facility] [address], [place], [place]  [address] Phone: [phone]; Fax: [phone]

## 2014-12-05 ENCOUNTER — Encounter: Payer: Self-pay | Admitting: Cardiology

## 2014-12-05 ENCOUNTER — Ambulatory Visit (INDEPENDENT_AMBULATORY_CARE_PROVIDER_SITE_OTHER): Payer: Medicare Other | Admitting: Cardiology

## 2014-12-05 VITALS — BP 100/60 | HR 85 | Ht 63.0 in | Wt 129.6 lb

## 2014-12-05 DIAGNOSIS — I480 Paroxysmal atrial fibrillation: Secondary | ICD-10-CM | POA: Diagnosis not present

## 2014-12-05 DIAGNOSIS — I10 Essential (primary) hypertension: Secondary | ICD-10-CM | POA: Diagnosis not present

## 2014-12-05 DIAGNOSIS — R002 Palpitations: Secondary | ICD-10-CM | POA: Diagnosis not present

## 2014-12-05 DIAGNOSIS — I483 Typical atrial flutter: Secondary | ICD-10-CM

## 2014-12-05 LAB — CBC WITH DIFFERENTIAL/PLATELET
Basophils Absolute: 0 10*3/uL (ref 0.0–0.1)
Basophils Relative: 0.6 % (ref 0.0–3.0)
EOS ABS: 0.1 10*3/uL (ref 0.0–0.7)
Eosinophils Relative: 2.7 % (ref 0.0–5.0)
HCT: 39.3 % (ref 36.0–46.0)
Hemoglobin: 13.4 g/dL (ref 12.0–15.0)
Lymphocytes Relative: 17.7 % (ref 12.0–46.0)
Lymphs Abs: 0.9 10*3/uL (ref 0.7–4.0)
MCHC: 34 g/dL (ref 30.0–36.0)
MCV: 91.1 fl (ref 78.0–100.0)
MONOS PCT: 15.9 % — AB (ref 3.0–12.0)
Monocytes Absolute: 0.8 10*3/uL (ref 0.1–1.0)
NEUTROS ABS: 3.3 10*3/uL (ref 1.4–7.7)
Neutrophils Relative %: 63.1 % (ref 43.0–77.0)
Platelets: 145 10*3/uL — ABNORMAL LOW (ref 150.0–400.0)
RBC: 4.32 Mil/uL (ref 3.87–5.11)
RDW: 13.8 % (ref 11.5–15.5)
WBC: 5.3 10*3/uL (ref 4.0–10.5)

## 2014-12-05 LAB — BASIC METABOLIC PANEL
BUN: 17 mg/dL (ref 6–23)
CO2: 32 mEq/L (ref 19–32)
Calcium: 9.6 mg/dL (ref 8.4–10.5)
Chloride: 105 mEq/L (ref 96–112)
Creatinine, Ser: 0.94 mg/dL (ref 0.40–1.20)
GFR: 60.67 mL/min (ref >60.00)
GLUCOSE: 94 mg/dL (ref 70–99)
Potassium: 4 mEq/L (ref 3.5–5.1)
Sodium: 139 mEq/L (ref 135–145)

## 2014-12-05 NOTE — Patient Instructions (Addendum)
Your physician has recommended you make the following change in your medication:  1) STOP LOSARTAN  Your physician recommends that you have lab work TODAY (BMET, CBC)  Your physician recommends that you schedule a follow-up appointment AS SOON AS POSSIBLE with Dr. Johney FrameAllred for evaluation of A-flutter.    Your physician recommends that you schedule a follow-up appointment in: 3 months with Dr. Mayford Knifeurner.

## 2015-01-04 ENCOUNTER — Ambulatory Visit: Payer: PRIVATE HEALTH INSURANCE | Admitting: Internal Medicine

## 2015-01-11 ENCOUNTER — Encounter: Payer: Self-pay | Admitting: Internal Medicine

## 2015-01-11 ENCOUNTER — Ambulatory Visit (INDEPENDENT_AMBULATORY_CARE_PROVIDER_SITE_OTHER): Payer: Medicare Other | Admitting: Internal Medicine

## 2015-01-11 VITALS — BP 124/66 | HR 100 | Ht 63.0 in | Wt 130.0 lb

## 2015-01-11 DIAGNOSIS — I483 Typical atrial flutter: Secondary | ICD-10-CM | POA: Diagnosis not present

## 2015-01-11 DIAGNOSIS — I480 Paroxysmal atrial fibrillation: Secondary | ICD-10-CM

## 2015-01-11 DIAGNOSIS — I10 Essential (primary) hypertension: Secondary | ICD-10-CM

## 2015-01-11 MED ORDER — FLECAINIDE ACETATE 50 MG PO TABS: ORAL_TABLET | ORAL | Status: DC

## 2015-01-11 NOTE — Patient Instructions (Addendum)
Medication Instructions: - Start flecainide 50 mg - take two tablets (100 mg) x 1 dose tonight, then take one tablet twice daily  Labwork: - none  Procedures/Testing: - Your physician has requested that you have an echocardiogram- same day as your follow up with Dr. Mayford Knifeurner on 03/07/15. Echocardiography is a painless test that uses sound waves to create images of your heart. It provides your doctor with information about the size and shape of your heart and how well your heart's chambers and valves are working. This procedure takes approximately one hour. There are no restrictions for this procedure.  Follow-Up: - Your physician recommends that you schedule a follow-up appointment in: 1 week with [name] in the a-fib clinic  Any Additional Special Instructions Will Be Listed Below (If Applicable). - none

## 2015-01-13 NOTE — Progress Notes (Signed)
Electrophysiology Office Note   Date:  01/13/2015   ID:  [name], DOB 12/04/1930, MRN [id number]  PCP:  PROVIDER NOT IN SYSTEM  Cardiologist:  Dr Mayford Knifeurner Primary Electrophysiologist: Hillis RangeJames Judah Carchi, MD    Chief Complaint  Patient presents with  . Atrial Fibrillation    Persistent     History of Present Illness: Lauren BoroughsDorothy M Daponte is a 81 y.o. female who presents today for electrophysiology evaluation.   The patient presents today for follow-up.  She continues to have intermittent episodes of afib/ atrial flutter.  Upon recent evaluation by Dr Mayford Knifeurner, she was in atrial flutter.  She reports symptoms of fatigue and decreased exercise tolerance.  She has occasional palpitations.  Today, she denies symptoms of palpitations, chest pain, shortness of breath, orthopnea, PND, lower extremity edema, claudication, dizziness, presyncope, syncope, bleeding, or neurologic sequela. The patient is tolerating medications without difficulties and is otherwise without complaint today.    Past Medical History  Diagnosis Date  . Diverticulosis   . Fibrocystic breast disease   . DJD (degenerative joint disease), multiple sites   . Osteopenia   . Macular pucker, left eye   . Sensory hearing loss, bilateral   . Presbycusis   . Colon polyp   . Persistent atrial fibrillation     s/p prior cardioversion  . Atrial flutter    Past Surgical History  Procedure Laterality Date  . Abdominal hysterectomy      with BSO  . Appendectomy    . Rotator cuff repair    . Eye srugery      macular pucker  . Cataract extraction, bilateral    . Cardioversion  09/18/2012    Procedure: CARDIOVERSION;  Surgeon: Quintella Reichertraci R Turner, MD;  [facility];  Service: Cardiovascular;  Laterality: N/A;  . Cardioversion N/A 01/05/2013    Procedure: CARDIOVERSION;  Surgeon: Quintella Reichertraci R Turner, MD;  Location: [facility] ENDOSCOPY;  Service: Cardiovascular;  Laterality: N/A;  . Tonsillectomy    . Tee without cardioversion N/A  06/24/2013    Procedure: TRANSESOPHAGEAL ECHOCARDIOGRAM (TEE);  Surgeon: Vesta MixerPhilip J Nahser, MD;  [facility];  Service: Cardiovascular;  Laterality: N/A;  . Cardioversion N/A 07/21/2013    Procedure: CARDIOVERSION;  Surgeon: Luis AbedJeffrey D Katz, MD;  [facility];  Service: Cardiovascular;  Laterality: N/A;  . Atrial fibrillation ablation N/A 06/25/2013    Procedure: ATRIAL FIBRILLATION ABLATION;  Surgeon: Gardiner RhymeJames D Brinden Kincheloe, MD;  [facility];  Service: Cardiovascular;  Laterality: N/A;     Current Outpatient Prescriptions  Medication Sig Dispense Refill  . b complex vitamins tablet Take 1 tablet by mouth every morning.     . Calcium Carbonate-Vitamin D (CALCIUM-VITAMIN D) 500-200 MG-UNIT per tablet Take 1 tablet by mouth every morning.     . metoprolol succinate (TOPROL-XL) 50 MG 24 hr tablet Take 1 tablet (50 mg total) by mouth every morning. 90 tablet 3  . Multiple Vitamins-Minerals (ICAPS PO) Take 1 tablet by mouth every morning.     . Rivaroxaban (XARELTO) 15 MG TABS tablet Take 1 tablet (15 mg total) by mouth daily with supper. 90 tablet 3  . flecainide (TAMBOCOR) 50 MG tablet Take two tablets x 1 dose tonight (01/11/15) then take one tablet by mouth twice daily 60 tablet 6   No current facility-administered medications for this visit.    Allergies:   Penicillins   Social History:  The patient  reports that she has never smoked. She does not have any smokeless tobacco  history on file. She reports that she does not drink alcohol or use illicit drugs.   Family History:  The patient's  family history includes Cancer in her brother and mother; Colon cancer in her father; Heart Problems in her sister.    ROS:  Please see the history of present illness.   All other systems are reviewed and negative.    PHYSICAL EXAM: VS:  BP 124/66 mmHg  Pulse 100  Ht  (1.6 m)  Wt 130 lb (58.968 kg)  BMI 23.03 kg/m2 , BMI Body mass index is 23.03 kg/(m^2). GEN: Well  nourished, well developed, in no acute distress HEENT: normal Neck: no JVD, carotid bruits, or masses Cardiac: iRRR; no murmurs, rubs, or gallops,no edema  Respiratory:  clear to auscultation bilaterally, normal work of breathing GI: soft, nontender, nondistended, + BS MS: no deformity or atrophy Skin: warm and dry  Neuro:  Strength and sensation are intact Psych: euthymic mood, full affect  EKG:  EKG is ordered today. The ekg ordered today shows atrial flutter   Recent Labs: 12/05/2014: BUN 17; Creatinine 0.94; Hemoglobin 13.4; Platelets 145.0*; Potassium 4.0; Sodium 139    Lipid Panel  No results found for: CHOL, TRIG, HDL, CHOLHDL, VLDL, LDLCALC, LDLDIRECT   Wt Readings from Last 3 Encounters:  01/11/15 130 lb (58.968 kg)  12/05/14 129 lb 9.6 oz (58.786 kg)  06/22/14 125 lb 9.6 oz (56.972 kg)      Other studies Reviewed: Additional studies/ records that were reviewed today include: prior ekgs, Dr Malachy Mood notes  ASSESSMENT AND PLAN:  1.Paroxysmal afib/ atrial tachycardia/ atrial flutter Continue xarelto for chads2vasc score of 3. Start flecainide  BID Return to see Rudi Coco NP in the af clinic in 1 week.  If she remains in atrial flutter, could increase flecainide to  BID.  She would like to avoid ablation at this time.  If she fails medical therapy with flecainide then I would advise ablation for both atrial fibrillation and atrial flutter.  2. HTN Stable No change required today  Follow-up in AF clinic as above Will need echo Follow-up with Dr Mayford Knife as scheduled  Current medicines are reviewed at length with the patient today.   The patient does not have concerns regarding her medicines.  The following changes were made today:  none  Labs/ tests ordered today include:  Orders Placed This Encounter  Procedures  . EKG 12-Lead  . 2D Echocardiogram without contrast    Signed, Hillis Range, MD  01/13/2015 11:07 AM     [facility] [address] [place] [address] [phone] (office) [phone] (fax)

## 2015-01-19 ENCOUNTER — Encounter (HOSPITAL_COMMUNITY): Payer: Self-pay | Admitting: Nurse Practitioner

## 2015-01-19 ENCOUNTER — Ambulatory Visit (HOSPITAL_COMMUNITY)
Admission: RE | Admit: 2015-01-19 | Discharge: 2015-01-19 | Disposition: A | Discharge status: Home / Self Care | Payer: Medicare Other | Source: Ambulatory Visit | Attending: Nurse Practitioner | Admitting: Nurse Practitioner

## 2015-01-19 VITALS — BP 126/80 | HR 58 | Ht 63.0 in | Wt 129.8 lb

## 2015-01-19 DIAGNOSIS — I481 Persistent atrial fibrillation: Secondary | ICD-10-CM | POA: Diagnosis present

## 2015-01-19 DIAGNOSIS — I10 Essential (primary) hypertension: Secondary | ICD-10-CM | POA: Diagnosis not present

## 2015-01-19 DIAGNOSIS — I4819 Other persistent atrial fibrillation: Secondary | ICD-10-CM

## 2015-01-19 NOTE — Progress Notes (Signed)
Patient ID: Lauren BoroughsDorothy M Cannon, female   DOB: 01/03/1931, 81 y.o.   MRN: [id number]  Date:  01/19/2015   ID:  Lauren BoroughsDorothy M Cannon, DOB 02/17/1931, MRN [id number]  PCP:  PROVIDER NOT IN SYSTEM  Cardiologist:  Dr Mayford Knifeurner Primary Electrophysiologist: Hillis RangeAllred, James, MD   Chief Complaint  Patient presents with  . Atrial Fibrillation     History of Present Illness: [name] is a 81 y.o. female who presents today for f/u in afib clinic. The patient was having issues with intermittent afib/flutter and on 4/13 was seen by Dr. Johney FrameAllred and started on flecainide 50 mg bid with plans to increase today if not converted.  EKG today shows SR and pt feels improved with increase in energy. Feels like she is tolerating AAD without any side effects.  Today, she denies symptoms of palpitations, chest pain, shortness of breath, orthopnea, PND, lower extremity edema, claudication, dizziness, presyncope, syncope, bleeding, or neurologic sequela. The patient is tolerating medications without difficulties and is otherwise without complaint today.    Past Medical History  Diagnosis Date  . Diverticulosis   . Fibrocystic breast disease   . DJD (degenerative joint disease), multiple sites   . Osteopenia   . Macular pucker, left eye   . Sensory hearing loss, bilateral   . Presbycusis   . Colon polyp   . Persistent atrial fibrillation     s/p prior cardioversion  . Atrial flutter    Past Surgical History  Procedure Laterality Date  . Abdominal hysterectomy      with BSO  . Appendectomy    . Rotator cuff repair    . Eye srugery      macular pucker  . Cataract extraction, bilateral    . Cardioversion  09/18/2012    Procedure: CARDIOVERSION;  Surgeon: Quintella Reichertraci R Turner, MD;  [facility];  Service: Cardiovascular;  Laterality: N/A;  . Cardioversion N/A 01/05/2013    Procedure: CARDIOVERSION;  Surgeon: Quintella Reichertraci R Turner, MD;  Location: [facility];  Service: Cardiovascular;  Laterality: N/A;  .  Tonsillectomy    . Tee without cardioversion N/A 06/24/2013    Procedure: TRANSESOPHAGEAL ECHOCARDIOGRAM (TEE);  Surgeon: Vesta MixerPhilip J Nahser, MD;  [facility];  Service: Cardiovascular;  Laterality: N/A;  . Cardioversion N/A 07/21/2013    Procedure: CARDIOVERSION;  Surgeon: Luis AbedJeffrey D Katz, MD;  [facility];  Service: Cardiovascular;  Laterality: N/A;  . Atrial fibrillation ablation N/A 06/25/2013    Procedure: ATRIAL FIBRILLATION ABLATION;  Surgeon: Gardiner RhymeJames D [name], MD;  [facility];  Service: Cardiovascular;  Laterality: N/A;     Current Outpatient Prescriptions  Medication Sig Dispense Refill  . b complex vitamins tablet Take 1 tablet by mouth every morning.     . Calcium Carbonate-Vitamin D (CALCIUM-VITAMIN D) 500-200 MG-UNIT per tablet Take 1 tablet by mouth every morning.     . flecainide (TAMBOCOR) 50 MG tablet Take two tablets x 1 dose tonight (01/11/15) then take one tablet by mouth twice daily 60 tablet 6  . metoprolol succinate (TOPROL-XL) 50 MG 24 hr tablet Take 1 tablet (50 mg total) by mouth every morning. 90 tablet 3  . Multiple Vitamins-Minerals (ICAPS PO) Take 1 tablet by mouth every morning.     . Rivaroxaban (XARELTO) 15 MG TABS tablet Take 1 tablet (15 mg total) by mouth daily with supper. 90 tablet 3   No current facility-administered medications for this encounter.    Allergies:   Penicillins  Social History:  The patient  reports that she has never smoked. She does not have any smokeless tobacco history on file. She reports that she does not drink alcohol or use illicit drugs.   Family History:  The patient's  family history includes Cancer in her brother and mother; Colon cancer in her father; Heart Problems in her sister.    ROS:  Please see the history of present illness.   All other systems are reviewed and negative.    PHYSICAL EXAM: VS:  BP 126/80 mmHg  Pulse 58  Ht  (1.6 m)  Wt 129 lb 12.8 oz (58.877 kg)  BMI 23.00 kg/m2  , BMI Body mass index is 23 kg/(m^2). GEN: Well nourished, well developed, in no acute distress HEENT: normal Neck: no JVD, carotid bruits, or masses Cardiac: RRR; no murmurs, rubs, or gallops,no edema  Respiratory:  clear to auscultation bilaterally, normal work of breathing GI: soft, nontender, nondistended, + BS MS: no deformity or atrophy Skin: warm and dry  Neuro:  Strength and sensation are intact Psych: euthymic mood, full affect  EKG:  EKG is ordered today. The ekg ordered today shows Sinus rhythm with first degree heart block,RAD, PR int 244 ms, QRS 86 ms, QTc 463 ms, no significant change when compared to last EKG in SR.   Recent Labs: 12/05/2014: BUN 17; Creatinine 0.94; Hemoglobin 13.4; Platelets 145.0*; Potassium 4.0; Sodium 139    Lipid Panel  No results found for: CHOL, TRIG, HDL, CHOLHDL, VLDL, LDLCALC, LDLDIRECT   Wt Readings from Last 3 Encounters:  01/11/15 130 lb (58.968 kg)  12/05/14 129 lb 9.6 oz (58.786 kg)  06/22/14 125 lb 9.6 oz (56.972 kg)      Epic records reviewed.  ASSESSMENT AND PLAN:  1.Paroxysmal afib/ atrial tachycardia/ atrial flutter Converted with and tolerating flecainide 50 mg bid, continue. Continue xarelto for chads2vasc score of 3.  2. HTN Stable No change required today  F/u with Dr. Mayford Knife as scheduled with echo 03/07/15.  Current medicines are reviewed at length with the patient today.   The patient does not have concerns regarding her medicines.  The following changes were made today:  none  Labs/ tests ordered today include:  Orders Placed This Encounter  Procedures  . EKG 12-Lead    Signed, Roniqua Kintz, NP  01/19/2015 11:18 AM

## 2015-01-20 NOTE — Addendum Note (Signed)
Encounter addended by: Simon RheinSandra C Adream Parzych, CCT on: 01/20/2015  8:33 AM<BR>     Documentation filed: Charges VN

## 2015-01-30 ENCOUNTER — Ambulatory Visit: Payer: PRIVATE HEALTH INSURANCE | Admitting: Internal Medicine

## 2015-02-08 ENCOUNTER — Encounter: Payer: Self-pay | Admitting: Cardiology

## 2015-03-07 ENCOUNTER — Other Ambulatory Visit (HOSPITAL_COMMUNITY): Payer: PRIVATE HEALTH INSURANCE

## 2015-03-07 ENCOUNTER — Ambulatory Visit: Payer: PRIVATE HEALTH INSURANCE | Admitting: Cardiology

## 2015-03-08 NOTE — Progress Notes (Signed)
Cardiology Office Note   Date:  03/09/2015   ID:  Kem BoroughsDorothy M Cannon, DOB 05/15/1931, MRN [id number]  PCP:  PROVIDER NOT IN SYSTEM    Chief Complaint  Patient presents with  . Follow-up    Paroxysmal atrial fib      History of Present Illness: This is an 81yo female with a history of paroxysmal atrial fibrillation s/p afib ablation and chronic systemic anticoagulation who presents today for followup. She is doing well. She denies any chest pain, SOB, DOE, LE edema, dizziness or syncope.    Past Medical History  Diagnosis Date  . Diverticulosis   . Fibrocystic breast disease   . DJD (degenerative joint disease), multiple sites   . Osteopenia   . Macular pucker, left eye   . Sensory hearing loss, bilateral   . Presbycusis   . Colon polyp   . Persistent atrial fibrillation     s/p prior cardioversion  . Atrial flutter     Past Surgical History  Procedure Laterality Date  . Abdominal hysterectomy      with BSO  . Appendectomy    . Rotator cuff repair    . Eye srugery      macular pucker  . Cataract extraction, bilateral    . Cardioversion  09/18/2012    Procedure: CARDIOVERSION;  Surgeon: Quintella Reichertraci R Turner, MD;  [facility];  Service: Cardiovascular;  Laterality: N/A;  . Cardioversion N/A 01/05/2013    Procedure: CARDIOVERSION;  Surgeon: Quintella Reichertraci R Turner, MD;  [facility]: [facility];  Service: Cardiovascular;  Laterality: N/A;  . Tonsillectomy    . Tee without cardioversion N/A 06/24/2013    Procedure: TRANSESOPHAGEAL ECHOCARDIOGRAM (TEE);  Surgeon: Vesta MixerPhilip J Nahser, MD;  [facility];  Service: Cardiovascular;  Laterality: N/A;  . Cardioversion N/A 07/21/2013    Procedure: CARDIOVERSION;  Surgeon: Luis AbedJeffrey D Katz, MD;  [facility];  Service: Cardiovascular;  Laterality: N/A;  . Atrial fibrillation ablation N/A 06/25/2013    Procedure: ATRIAL FIBRILLATION ABLATION;  Surgeon: Gardiner RhymeJames D [name], MD;  [facility];   Service: Cardiovascular;  Laterality: N/A;     Current Outpatient Prescriptions  Medication Sig Dispense Refill  . b complex vitamins tablet Take 1 tablet by mouth every morning.     . Calcium Carbonate-Vitamin D (CALCIUM-VITAMIN D) 500-200 MG-UNIT per tablet Take 1 tablet by mouth every morning.     . flecainide (TAMBOCOR) 50 MG tablet Take two tablets x 1 dose tonight (01/11/15) then take one tablet by mouth twice daily 60 tablet 6  . metoprolol succinate (TOPROL-XL) 50 MG 24 hr tablet Take 1 tablet (50 mg total) by mouth every morning. 90 tablet 3  . Multiple Vitamins-Minerals (ICAPS PO) Take 1 tablet by mouth every morning.     . Rivaroxaban (XARELTO) 15 MG TABS tablet Take 1 tablet (15 mg total) by mouth daily with supper. 90 tablet 3   No current facility-administered medications for this visit.    Allergies:   Penicillins    Social History:  The patient  reports that she has never smoked. She does not have any smokeless tobacco history on file. She reports that she does not drink alcohol or use illicit drugs.   Family History:  The patient's family history includes Cancer in her brother and mother; Colon cancer in her father; Heart Problems in her sister.    ROS:  Please see the  history of present illness.   Otherwise, review of systems are positive for back pain.   All other systems are reviewed and negative.    PHYSICAL EXAM: VS:  BP 138/74 mmHg  Pulse 71  Ht  (1.6 m)  Wt 127 lb 12.8 oz (57.97 kg)  BMI 22.64 kg/m2  SpO2 97% , BMI Body mass index is 22.64 kg/(m^2). GEN: Well nourished, well developed, in no acute distress HEENT: normal Neck: no JVD, carotid bruits, or masses Cardiac: slightly irregular; no murmurs, rubs, or gallops,no edema  Respiratory:  clear to auscultation bilaterally, normal work of breathing GI: soft, nontender, nondistended, + BS MS: no deformity or atrophy Skin: warm and dry, no rash Neuro:  Strength and sensation are intact Psych:  euthymic mood, full affect   EKG:  EKG was ordered today and showed atrial flutter with variable block at 67bpm    Recent Labs: 12/05/2014: BUN 17; Creatinine, Ser 0.94; Hemoglobin 13.4; Platelets 145.0*; Potassium 4.0; Sodium 139    Lipid Panel No results found for: CHOL, TRIG, HDL, CHOLHDL, VLDL, LDLCALC, LDLDIRECT    Wt Readings from Last 3 Encounters:  03/09/15 127 lb 12.8 oz (57.97 kg)  01/19/15 129 lb 12.8 oz (58.877 kg)  01/11/15 130 lb (58.968 kg)      ASSESSMENT AND PLAN: 1.  PAF s/p afib ablation.Started on Flecainide by Dr. Johney Frame and converted to NSR but now is in atrial flutter.  Patient wanted to try to avoid ablation.   - Will increase Flecainide to  BID.  Repeat EKG in 1 week.   - continue Toprol/Xarelto - I will set her up for an ETT in 2 weeks due to recently starting flecainide and now increasing the dose 2.  Chronic systemic anticoagulation 3.  HTN well controlled  - continue Toprol   Current medicines are reviewed at length with the patient today.  The patient does not have concerns regarding medicines.  The following changes have been made:  no change  Labs/ tests ordered today: See above Assessment and Plan No orders of the defined types were placed in this encounter.     Disposition:   FU with me in 3 months  Signed, Quintella Reichert, MD  03/09/2015 2:23 PM    [facility] [address] [place], [place], [place]  [address] Phone: [phone]; Fax: [phone]

## 2015-03-09 ENCOUNTER — Encounter: Payer: Self-pay | Admitting: Cardiology

## 2015-03-09 ENCOUNTER — Other Ambulatory Visit: Payer: Self-pay | Admitting: Internal Medicine

## 2015-03-09 ENCOUNTER — Other Ambulatory Visit: Payer: Self-pay

## 2015-03-09 ENCOUNTER — Ambulatory Visit (INDEPENDENT_AMBULATORY_CARE_PROVIDER_SITE_OTHER): Payer: Medicare Other | Admitting: Cardiology

## 2015-03-09 ENCOUNTER — Ambulatory Visit (HOSPITAL_COMMUNITY): Payer: Medicare Other | Attending: Internal Medicine

## 2015-03-09 VITALS — BP 138/74 | HR 71 | Ht 63.0 in | Wt 127.8 lb

## 2015-03-09 DIAGNOSIS — I10 Essential (primary) hypertension: Secondary | ICD-10-CM | POA: Diagnosis not present

## 2015-03-09 DIAGNOSIS — I483 Typical atrial flutter: Secondary | ICD-10-CM

## 2015-03-09 DIAGNOSIS — I517 Cardiomegaly: Secondary | ICD-10-CM | POA: Insufficient documentation

## 2015-03-09 DIAGNOSIS — R002 Palpitations: Secondary | ICD-10-CM | POA: Diagnosis not present

## 2015-03-09 DIAGNOSIS — I340 Nonrheumatic mitral (valve) insufficiency: Secondary | ICD-10-CM | POA: Diagnosis not present

## 2015-03-09 DIAGNOSIS — I480 Paroxysmal atrial fibrillation: Secondary | ICD-10-CM

## 2015-03-09 DIAGNOSIS — I4892 Unspecified atrial flutter: Secondary | ICD-10-CM | POA: Diagnosis present

## 2015-03-09 MED ORDER — FLECAINIDE ACETATE 100 MG PO TABS: 100.0000 mg | ORAL_TABLET | Freq: Two times a day (BID) | ORAL | Status: DC

## 2015-03-09 NOTE — Patient Instructions (Addendum)
Medication Instructions:  Your physician has recommended you make the following change in your medication:  1) INCREASE FLECAINIDE to 100 mg TWICE DAILY  Labwork: None  Testing/Procedures: Your physician has requested that you have an exercise tolerance test IN TWO WEEKS. For further information please visit [email or web address] Please also follow instruction sheet, as given.  Follow-Up: Dr. Mayford Knife recommends you come back in ONE WEEK for an EKG with a nurse.  Your physician wants you to follow-up in: 3 months with Dr. Mayford Knife. You will receive a reminder letter in the mail two months in advance. If you don't receive a letter, please call our office to schedule the follow-up appointment.   Any Other Special Instructions Will Be Listed Below (If Applicable).

## 2015-03-15 ENCOUNTER — Ambulatory Visit (INDEPENDENT_AMBULATORY_CARE_PROVIDER_SITE_OTHER): Payer: Medicare Other | Admitting: *Deleted

## 2015-03-15 VITALS — BP 149/76 | HR 55 | Ht 63.0 in | Wt 129.5 lb

## 2015-03-15 DIAGNOSIS — I480 Paroxysmal atrial fibrillation: Secondary | ICD-10-CM | POA: Diagnosis not present

## 2015-03-15 NOTE — Progress Notes (Signed)
1.) Reason for visit: for an EKG   2.) Name of MD requesting visit: Dr. Mayford Knife  3.) H&P:Paroxysmal Atrial fibrillation.   4.) ROS related to problem: Flecainide dose was increased to 100 mg twice a day.*  5.) Assessment and plan per MD: EKG was read per Ward Givens NP, SB 55 beats/minute. Pt okay to D/C and continue medication.

## 2015-03-20 ENCOUNTER — Telehealth: Payer: Self-pay | Admitting: Cardiology

## 2015-03-20 NOTE — Telephone Encounter (Signed)
New message       If Home Health RN is calling please get Coumadin Nurse on the phone STAT  1.  Are you calling in regards to an appointment? no 2.  Are you calling for a refill ? no  3.  Are you having bleeding issues?  Pt had a nosebleed sat and sunday  4.  Do you need clearance to hold Coumadin? No Pt is on xarelto

## 2015-03-20 NOTE — Telephone Encounter (Signed)
Confirmed appointment time and date with patient. 

## 2015-03-20 NOTE — Telephone Encounter (Signed)
Left message to call back  

## 2015-03-20 NOTE — Telephone Encounter (Signed)
Patient lives in [place]. [facility] and the earliest they could schedule is Thursday at 210 with Dr. Harlon Flor.   Left message to call back to give appointment time and date.

## 2015-03-20 NOTE — Telephone Encounter (Signed)
Patient st she has had nosebleeds in the morning the last two days. The nosebleeds lasted about 30 minutes each. She skipped her dose of Xarelto last night and did not have a nosebleed this AM. She has no other complaints.  To Dr. Mayford Knife and Kennon Rounds for recommendations.

## 2015-03-20 NOTE — Telephone Encounter (Signed)
Please get her in to see ENT today for evaluation

## 2015-03-20 NOTE — Telephone Encounter (Signed)
Follow Up ° ° ° ° °Pt is returning call from earlier. Please call. °

## 2015-03-27 ENCOUNTER — Ambulatory Visit (HOSPITAL_COMMUNITY)
Admission: RE | Admit: 2015-03-27 | Discharge: 2015-03-27 | Disposition: A | Discharge status: Home / Self Care | Payer: Medicare Other | Source: Ambulatory Visit | Attending: Cardiology | Admitting: Cardiology

## 2015-03-27 DIAGNOSIS — I480 Paroxysmal atrial fibrillation: Secondary | ICD-10-CM | POA: Diagnosis not present

## 2015-03-27 DIAGNOSIS — I483 Typical atrial flutter: Secondary | ICD-10-CM

## 2015-03-27 DIAGNOSIS — R002 Palpitations: Secondary | ICD-10-CM

## 2015-03-29 LAB — EXERCISE TOLERANCE TEST
CHL CUP MPHR: 139 {beats}/min
CHL RATE OF PERCEIVED EXERTION: 18
CSEPHR: 73 %
CSEPPHR: 96 {beats}/min
Estimated workload: 4.9 METS
Exercise duration (min): 3 min
Exercise duration (sec): 18 s
Rest HR: 59 {beats}/min

## 2015-06-09 ENCOUNTER — Encounter: Payer: Self-pay | Admitting: Cardiology

## 2015-06-09 ENCOUNTER — Ambulatory Visit (INDEPENDENT_AMBULATORY_CARE_PROVIDER_SITE_OTHER): Payer: Medicare Other | Admitting: Cardiology

## 2015-06-09 VITALS — BP 140/68 | HR 54 | Ht 63.0 in | Wt 129.6 lb

## 2015-06-09 DIAGNOSIS — I4892 Unspecified atrial flutter: Secondary | ICD-10-CM

## 2015-06-09 DIAGNOSIS — R002 Palpitations: Secondary | ICD-10-CM

## 2015-06-09 DIAGNOSIS — I10 Essential (primary) hypertension: Secondary | ICD-10-CM | POA: Diagnosis not present

## 2015-06-09 DIAGNOSIS — I4819 Other persistent atrial fibrillation: Secondary | ICD-10-CM | POA: Insufficient documentation

## 2015-06-09 LAB — CBC WITH DIFFERENTIAL/PLATELET
Basophils Absolute: 0.1 10*3/uL (ref 0.0–0.1)
Basophils Relative: 0.8 % (ref 0.0–3.0)
Eosinophils Absolute: 0.2 10*3/uL (ref 0.0–0.7)
Eosinophils Relative: 2.7 % (ref 0.0–5.0)
HEMATOCRIT: 44.5 % (ref 36.0–46.0)
Hemoglobin: 14.6 g/dL (ref 12.0–15.0)
LYMPHS PCT: 14.4 % (ref 12.0–46.0)
Lymphs Abs: 0.9 10*3/uL (ref 0.7–4.0)
MCHC: 32.9 g/dL (ref 30.0–36.0)
MCV: 95 fl (ref 78.0–100.0)
MONO ABS: 0.7 10*3/uL (ref 0.1–1.0)
Monocytes Relative: 11.4 % (ref 3.0–12.0)
NEUTROS ABS: 4.6 10*3/uL (ref 1.4–7.7)
Neutrophils Relative %: 70.7 % (ref 43.0–77.0)
PLATELETS: 163 10*3/uL (ref 150.0–400.0)
RBC: 4.68 Mil/uL (ref 3.87–5.11)
RDW: 13.9 % (ref 11.5–15.5)
WBC: 6.5 10*3/uL (ref 4.0–10.5)

## 2015-06-09 LAB — BASIC METABOLIC PANEL
BUN: 16 mg/dL (ref 6–23)
CALCIUM: 10.2 mg/dL (ref 8.4–10.5)
CO2: 32 mEq/L (ref 19–32)
Chloride: 103 mEq/L (ref 96–112)
Creatinine, Ser: 1.05 mg/dL (ref 0.40–1.20)
GFR: 53.33 mL/min — ABNORMAL LOW (ref >60.00)
Glucose, Bld: 101 mg/dL — ABNORMAL HIGH (ref 70–99)
POTASSIUM: 4.2 meq/L (ref 3.5–5.1)
Sodium: 142 mEq/L (ref 135–145)

## 2015-06-09 LAB — TSH: TSH: 2.4 u[IU]/mL (ref 0.35–4.50)

## 2015-06-09 NOTE — Patient Instructions (Signed)
Medication Instructions: - no changes  Labwork: - Your physician recommends that you have lab work today: TSH/ CBC/ BMP  Procedures/Testing: - Your physician has recommended that you wear an event monitor (30 day). Event monitors are medical devices that record the heart's electrical activity. Doctors most often [place] these monitors to diagnose arrhythmias. Arrhythmias are problems with the speed or rhythm of the heartbeat. The monitor is a small, portable device. You can wear one while you do your normal daily activities. This is usually used to diagnose what is causing palpitations/syncope (passing out).  Follow-Up: - Your physician wants you to follow-up in: 6 months with Dr. Mayford Knife. You will receive a reminder letter in the mail two months in advance. If you don't receive a letter, please call our office to schedule the follow-up appointment.  Any Additional Special Instructions Will Be Listed Below (If Applicable). - none

## 2015-06-09 NOTE — Progress Notes (Signed)
Cardiology Office Note   Date:  06/09/2015   ID:  [name], DOB 1931-04-07, MRN [id number]  PCP:  Denny Levy, DO    Chief Complaint  Patient presents with  . PAF      History of Present Illness: This is an 81yo female with a history of paroxysmal atrial fibrillation s/p afib ablation and chronic systemic anticoagulation who presents today for followup. She is doing well. She denies any chest pain, SOB, DOE, LE edema, dizziness or syncope. She has noticed that when she wakes up in the am her heart will be racing and be irregular and then when she gets up moving around it resolves.  She is also complaining that her hair is falling out and not sleeping well.  She has also noticed her skin peeling.     Past Medical History  Diagnosis Date  . Diverticulosis   . Fibrocystic breast disease   . DJD (degenerative joint disease), multiple sites   . Osteopenia   . Macular pucker, left eye   . Sensory hearing loss, bilateral   . Presbycusis   . Colon polyp   . Atrial flutter   . PAF (paroxysmal atrial fibrillation)     s/p ablation    Past Surgical History  Procedure Laterality Date  . Abdominal hysterectomy      with BSO  . Appendectomy    . Rotator cuff repair    . Eye srugery      macular pucker  . Cataract extraction, bilateral    . Cardioversion  09/18/2012    Procedure: CARDIOVERSION;  Surgeon: Quintella Reichert, MD;  Location: [facility] ENDOSCOPY;  Service: Cardiovascular;  Laterality: N/A;  . Cardioversion N/A 01/05/2013    Procedure: CARDIOVERSION;  Surgeon: Quintella Reichert, MD;  Location: [facility]OSCOPY;  Service: Cardiovascular;  Laterality: N/A;  . Tonsillectomy    . Tee without cardioversion N/A 06/24/2013    Procedure: TRANSESOPHAGEAL ECHOCARDIOGRAM (TEE);  Surgeon: Vesta Mixer, MD;  Location: [facility] ENDOSCOPY;  Service: Cardiovascular;  Laterality: N/A;  . Cardioversion N/A 07/21/2013    Procedure: CARDIOVERSION;  Surgeon: Luis Abed,  MD;  Location: [facility] ENDOSCOPY;  Service: Cardiovascular;  Laterality: N/A;  . Atrial fibrillation ablation N/A 06/25/2013    Procedure: ATRIAL FIBRILLATION ABLATION;  Surgeon: Gardiner Rhyme, MD;  Location: [facility] CATH LAB;  Service: Cardiovascular;  Laterality: N/A;     Current Outpatient Prescriptions  Medication Sig Dispense Refill  . b complex vitamins tablet Take 1 tablet by mouth every morning.     . Calcium Carbonate-Vitamin D (CALCIUM-VITAMIN D) 500-200 MG-UNIT per tablet Take 1 tablet by mouth every morning.     . flecainide (TAMBOCOR) 100 MG tablet Take 1 tablet (100 mg total) by mouth 2 (two) times daily. 60 tablet 6  . metoprolol succinate (TOPROL-XL) 50 MG 24 hr tablet Take 1 tablet (50 mg total) by mouth every morning. 90 tablet 3  . Multiple Vitamins-Minerals (ICAPS PO) Take 1 tablet by mouth every morning.     . Rivaroxaban (XARELTO) 15 MG TABS tablet Take 1 tablet (15 mg total) by mouth daily with supper. 90 tablet 3   No current facility-administered medications for this visit.    Allergies:   Penicillins    Social History:  The patient  reports that she has never smoked. She does not have any smokeless tobacco history on file. She reports that  she does not drink alcohol or use illicit drugs.   Family History:  The patient's family history includes Cancer in her brother and mother; Colon cancer in her father; Heart Problems in her sister.    ROS:  Please see the history of present illness.   Otherwise, review of systems are positive for none.   All other systems are reviewed and negative.    PHYSICAL EXAM: VS:  BP 140/68 mmHg  Pulse 54  Ht  (1.6 m)  Wt 129 lb 9.6 oz (58.786 kg)  BMI 22.96 kg/m2  SpO2 98% , BMI Body mass index is 22.96 kg/(m^2). GEN: Well nourished, well developed, in no acute distress HEENT: normal Neck: no JVD, carotid bruits, or masses Cardiac: RRR; no murmurs, rubs, or gallops,no edema  Respiratory:  clear to auscultation bilaterally, normal  work of breathing GI: soft, nontender, nondistended, + BS MS: no deformity or atrophy Skin: warm and dry, no rash Neuro:  Strength and sensation are intact Psych: euthymic mood, full affect   EKG:  EKG was ordered today and showed sinus bradycardia at 52bpm with no ST changes.    Recent Labs: 12/05/2014: BUN 17; Creatinine, Ser 0.94; Hemoglobin 13.4; Platelets 145.0*; Potassium 4.0; Sodium 139    Lipid Panel No results found for: CHOL, TRIG, HDL, CHOLHDL, VLDL, LDLCALC, LDLDIRECT    Wt Readings from Last 3 Encounters:  06/09/15 129 lb 9.6 oz (58.786 kg)  03/15/15 129 lb 8 oz (58.741 kg)  03/09/15 127 lb 12.8 oz (57.97 kg)      ASSESSMENT AND PLAN: 1. PAF s/p afib ablation and then atrial flutter.  Maintaining NSR but having irregular heart beats in the am.  I will get a 30 day heart monitor to assess for recurrent PAF/flutter.  She is also complaining of her hair falling out and problems with her skin that could be related to Toprol but also could be due to thyroid issues.  I will check a TSH.   - continue Toprol/Xarelto/Flecainide - check NOAC panel 2. Chronic systemic anticoagulation 3. HTN well controlled  - continue Toprol   Current medicines are reviewed at length with the patient today.  The patient does not have concerns regarding medicines.  The following changes have been made:  no change  Labs/ tests ordered today: See above Assessment and Plan No orders of the defined types were placed in this encounter.     Disposition:   FU with me in 6 months  Signed, Quintella Reichert, MD  06/09/2015 1:18 PM    [facility] [address] [place], [place], [place]  [address] Phone: [phone]; Fax: [phone]

## 2015-06-12 ENCOUNTER — Telehealth: Payer: Self-pay | Admitting: Cardiology

## 2015-06-12 MED ORDER — DILTIAZEM HCL ER COATED BEADS 240 MG PO CP24: 240.0000 mg | ORAL_CAPSULE | Freq: Every day | ORAL | Status: DC

## 2015-06-12 NOTE — Telephone Encounter (Signed)
New message ° ° ° ° °Returning Katy's call °

## 2015-06-12 NOTE — Telephone Encounter (Signed)
-----   Message from Quintella Reichert, MD sent at 06/09/2015  4:01 PM EDT ----- Please stop Toprol and start Cardizem CD  daily due to hair falling out.  Please have her check BP and HR daily for a week and call with results.  Please have her let us know if hair issues do not resolve

## 2015-06-12 NOTE — Telephone Encounter (Signed)
Informed patient of results and verbal understanding expressed.   Instructed patient to STOP TOPROL and START CARDIZEM CD 240 mg daily. Instructed patient to check BP and HR daily for a week and call with results and to call if hair issues do not resolve. Patient also st she will not complete the event monitor. She st she will not pay to do the monitor when she has had no other irregular heart rates since. Message sent to scheduling to cancel appointment.

## 2015-06-20 ENCOUNTER — Telehealth: Payer: Self-pay | Admitting: Cardiology

## 2015-06-20 NOTE — Telephone Encounter (Signed)
BP readings to Dr. Turner for review. 

## 2015-06-20 NOTE — Telephone Encounter (Signed)
Pt c/o BP issue:  Pt called states that she was advised to send in her readings.  1. What are your last 5 BP readings?   9/13  119/66 Pulse 45 9/14  121/62 Pulse 52 9//15  7am 90/57 Pulse 55           10am 120/78 Pulse 66 9/16   120/60 Pulse 51 9/17   114/63 Pulse 73 9/18   110/68 Pulse 77 9/19   114/71 Pulse 73  2. Are you having any other symptoms (ex. Dizziness, headache, blurred vision, passed out)? No

## 2015-06-20 NOTE — Telephone Encounter (Signed)
BP and HR stable - no change

## 2015-06-21 NOTE — Telephone Encounter (Signed)
Patient understands BP and HR are stable and no changes are being made. Patient grateful for call.

## 2015-07-12 ENCOUNTER — Other Ambulatory Visit: Payer: Self-pay

## 2015-07-12 DIAGNOSIS — I483 Typical atrial flutter: Secondary | ICD-10-CM

## 2015-07-12 MED ORDER — FLECAINIDE ACETATE 100 MG PO TABS: 100.0000 mg | ORAL_TABLET | Freq: Two times a day (BID) | ORAL | Status: DC

## 2015-07-12 MED ORDER — RIVAROXABAN 15 MG PO TABS: 15.0000 mg | ORAL_TABLET | Freq: Every day | ORAL | Status: DC

## 2015-07-12 NOTE — Telephone Encounter (Signed)
Pt called to get her Flecainide 100 mg 1 BID and Xarelto 15 mg refilled. [facility] at [facility] but she wanted [facility] to [facility]. [facility] to let them know that I [facility] the Rx somewhere else and not to fill the refills.

## 2015-07-28 ENCOUNTER — Other Ambulatory Visit: Payer: Self-pay | Admitting: *Deleted

## 2015-07-28 MED ORDER — DILTIAZEM HCL ER COATED BEADS 240 MG PO CP24: 240.0000 mg | ORAL_CAPSULE | Freq: Every day | ORAL | Status: DC

## 2015-09-04 ENCOUNTER — Encounter: Payer: Self-pay | Admitting: Internal Medicine

## 2015-09-04 ENCOUNTER — Ambulatory Visit (INDEPENDENT_AMBULATORY_CARE_PROVIDER_SITE_OTHER): Payer: Medicare Other | Admitting: Internal Medicine

## 2015-09-04 VITALS — BP 160/80 | HR 67 | Ht 63.0 in | Wt 131.6 lb

## 2015-09-04 DIAGNOSIS — I480 Paroxysmal atrial fibrillation: Secondary | ICD-10-CM | POA: Diagnosis not present

## 2015-09-04 DIAGNOSIS — I483 Typical atrial flutter: Secondary | ICD-10-CM | POA: Diagnosis not present

## 2015-09-04 DIAGNOSIS — I10 Essential (primary) hypertension: Secondary | ICD-10-CM | POA: Diagnosis not present

## 2015-09-04 MED ORDER — FLECAINIDE ACETATE 50 MG PO TABS: 50.0000 mg | ORAL_TABLET | Freq: Two times a day (BID) | ORAL | Status: DC

## 2015-09-04 MED ORDER — RIVAROXABAN 15 MG PO TABS: 15.0000 mg | ORAL_TABLET | Freq: Every day | ORAL | Status: DC

## 2015-09-04 MED ORDER — DILTIAZEM HCL ER COATED BEADS 240 MG PO CP24: 240.0000 mg | ORAL_CAPSULE | Freq: Every day | ORAL | Status: DC

## 2015-09-04 NOTE — Progress Notes (Signed)
Electrophysiology Office Note   Date:  09/04/2015   ID:  [name], DOB 1931-08-21, MRN [id number]  PCP:  Pcp Not In System  Cardiologist:  Dr Mayford Knife Primary Electrophysiologist: Hillis Range, MD    Chief Complaint  Patient presents with  . Typical atrial flutter  . PAF     History of Present Illness: [name] is a 81 y.o. female who presents today for electrophysiology evaluation.   The patient presents today for follow-up.   Presently she feels that her afib is better controlled.  She is pleased with her current health state. Today, she denies symptoms of palpitations, chest pain, shortness of breath, orthopnea, PND, lower extremity edema, claudication, dizziness, presyncope, syncope, bleeding, or neurologic sequela. The patient is tolerating medications without difficulties and is otherwise without complaint today.    Past Medical History  Diagnosis Date  . Diverticulosis   . Fibrocystic breast disease   . DJD (degenerative joint disease), multiple sites   . Osteopenia   . Macular pucker, left eye   . Sensory hearing loss, bilateral   . Presbycusis   . Colon polyp   . Atrial flutter (HCC)   . PAF (paroxysmal atrial fibrillation) [facility])     s/p ablation   Past Surgical History  Procedure Laterality Date  . Abdominal hysterectomy      with BSO  . Appendectomy    . Rotator cuff repair    . Eye srugery      macular pucker  . Cataract extraction, bilateral    . Cardioversion  09/18/2012    Procedure: CARDIOVERSION;  Surgeon: Quintella Reichert, MD;  Location: [facility] ENDOSCOPY;  Service: Cardiovascular;  Laterality: N/A;  . Cardioversion N/A 01/05/2013    Procedure: CARDIOVERSION;  Surgeon: Quintella Reichert, MD;  Location: [facility] ENDOSCOPY;  Service: Cardiovascular;  Laterality: N/A;  . Tonsillectomy    . Tee without cardioversion N/A 06/24/2013    Procedure: TRANSESOPHAGEAL ECHOCARDIOGRAM (TEE);  Surgeon: Vesta Mixer, MD;  Location: [facility] ENDOSCOPY;  Service:  Cardiovascular;  Laterality: N/A;  . Cardioversion N/A 07/21/2013    Procedure: CARDIOVERSION;  Surgeon: Luis Abed, MD;  Location: [facility] ENDOSCOPY;  Service: Cardiovascular;  Laterality: N/A;  . Atrial fibrillation ablation N/A 06/25/2013    Procedure: ATRIAL FIBRILLATION ABLATION;  Surgeon: Gardiner Rhyme, MD;  Location: [facility] CATH LAB;  Service: Cardiovascular;  Laterality: N/A;     Current Outpatient Prescriptions  Medication Sig Dispense Refill  . Calcium Carbonate-Vitamin D (CALCIUM-VITAMIN D) 500-200 MG-UNIT per tablet Take 1 tablet by mouth every morning.     . diltiazem (CARDIZEM CD) 240 MG 24 hr capsule Take 1 capsule (240 mg total) by mouth daily. 90 capsule 3  . flecainide (TAMBOCOR) 50 MG tablet Take 1 tablet (50 mg total) by mouth 2 (two) times daily. 180 tablet 3  . Multiple Vitamins-Minerals (ICAPS PO) Take 1 tablet by mouth every morning.     . Rivaroxaban (XARELTO) 15 MG TABS tablet Take 1 tablet (15 mg total) by mouth daily with supper. 90 tablet 3   No current facility-administered medications for this visit.    Allergies:   Penicillins   Social History:  The patient  reports that she has never smoked. She does not have any smokeless tobacco history on file. She reports that she does not drink alcohol or use illicit drugs.   Family History:  The patient's  family history includes Cancer in her brother and mother; Colon cancer in her father; Heart  Problems in her sister.    ROS:  Please see the history of present illness.   All other systems are reviewed and negative.    PHYSICAL EXAM: VS:  BP 160/80 mmHg  Pulse 67  Ht 5\' 3"  (1.6 m)  Wt 131 lb 9.6 oz (59.693 kg)  BMI 23.32 kg/m2 , BMI Body mass index is 23.32 kg/(m^2). GEN: Well nourished, well developed, in no acute distress HEENT: normal Neck: no JVD, carotid bruits, or masses Cardiac: iRRR; no murmurs, rubs, or gallops,no edema  Respiratory:  clear to auscultation bilaterally, normal work of breathing GI:  soft, nontender, nondistended, + BS MS: no deformity or atrophy Skin: warm and dry  Neuro:  Strength and sensation are intact Psych: euthymic mood, full affect  EKG:  EKG is ordered today. The ekg ordered today shows sinus rhythm   Recent Labs: 06/09/2015: BUN 16; Creatinine, Ser 1.05; Hemoglobin 14.6; Platelets 163.0; Potassium 4.2; Sodium 142; TSH 2.40    Lipid Panel  No results found for: CHOL, TRIG, HDL, CHOLHDL, VLDL, LDLCALC, LDLDIRECT   Wt Readings from Last 3 Encounters:  09/04/15 131 lb 9.6 oz (59.693 kg)  06/09/15 129 lb 9.6 oz (58.786 kg)  03/15/15 129 lb 8 oz (58.741 kg)      Other studies Reviewed: Additional studies/ records that were reviewed today include: prior ekgs, Dr Malachy Moodurners notes  ASSESSMENT AND PLAN:  1.Paroxysmal afib/ atrial tachycardia/ atrial flutter Continue xarelto for chads2vasc score of 3. Stable on flecainide 100mg  BID.  Will try to reduce flecainide to 50mg  BID today given advanced age and concerns of pro arrhythmia.  She may ultimately require that we return to 100mg  BID if her arrhythmias return.  2. HTN Stable No change required today  Follow-up with Dr Mayford Knifeurner in 6 months I will see in a year  Current medicines are reviewed at length with the patient today.   The patient does not have concerns regarding her medicines.  The following changes were made today:  none  Labs/ tests ordered today include:  Orders Placed This Encounter  Procedures  . EKG 12-Lead    Signed, Hillis RangeJames Sundus Pete, MD  09/04/2015 8:41 PM     [facility] [address] [place] [address] [phone] (office) [phone] (fax)

## 2015-09-04 NOTE — Patient Instructions (Signed)
Medication Instructions:  Your physician has recommended you make the following change in your medication:  1. DECREASE Flecainide to 50mg  take one tablet by mouth twice a day  Labwork: No new orders.   Testing/Procedures: No new orders.   Follow-Up: Your physician wants you to follow-up in: 6 MONTHS with Dr Mayford Knifeurner.  You will receive a reminder letter in the mail two months in advance. If you don't receive a letter, please call our office to schedule the follow-up appointment.  Your physician wants you to follow-up in: 1 YEAR with Dr Johney FrameAllred.  You will receive a reminder letter in the mail two months in advance. If you don't receive a letter, please call our office to schedule the follow-up appointment.   Any Other Special Instructions Will Be Listed Below (If Applicable).     If you need a refill on your cardiac medications before your next appointment, please call your pharmacy.

## 2015-10-06 IMAGING — CR DG CHEST 2V
2 series · 2 of 2 positions shown · non-contrast
Comparison: 11/29/2011

CLINICAL DATA: Atrial fibrillation. Cough. Shortness of breath.
Chest pain.

EXAM:
CHEST  2 VIEW

[view not recorded (1 of 2)]
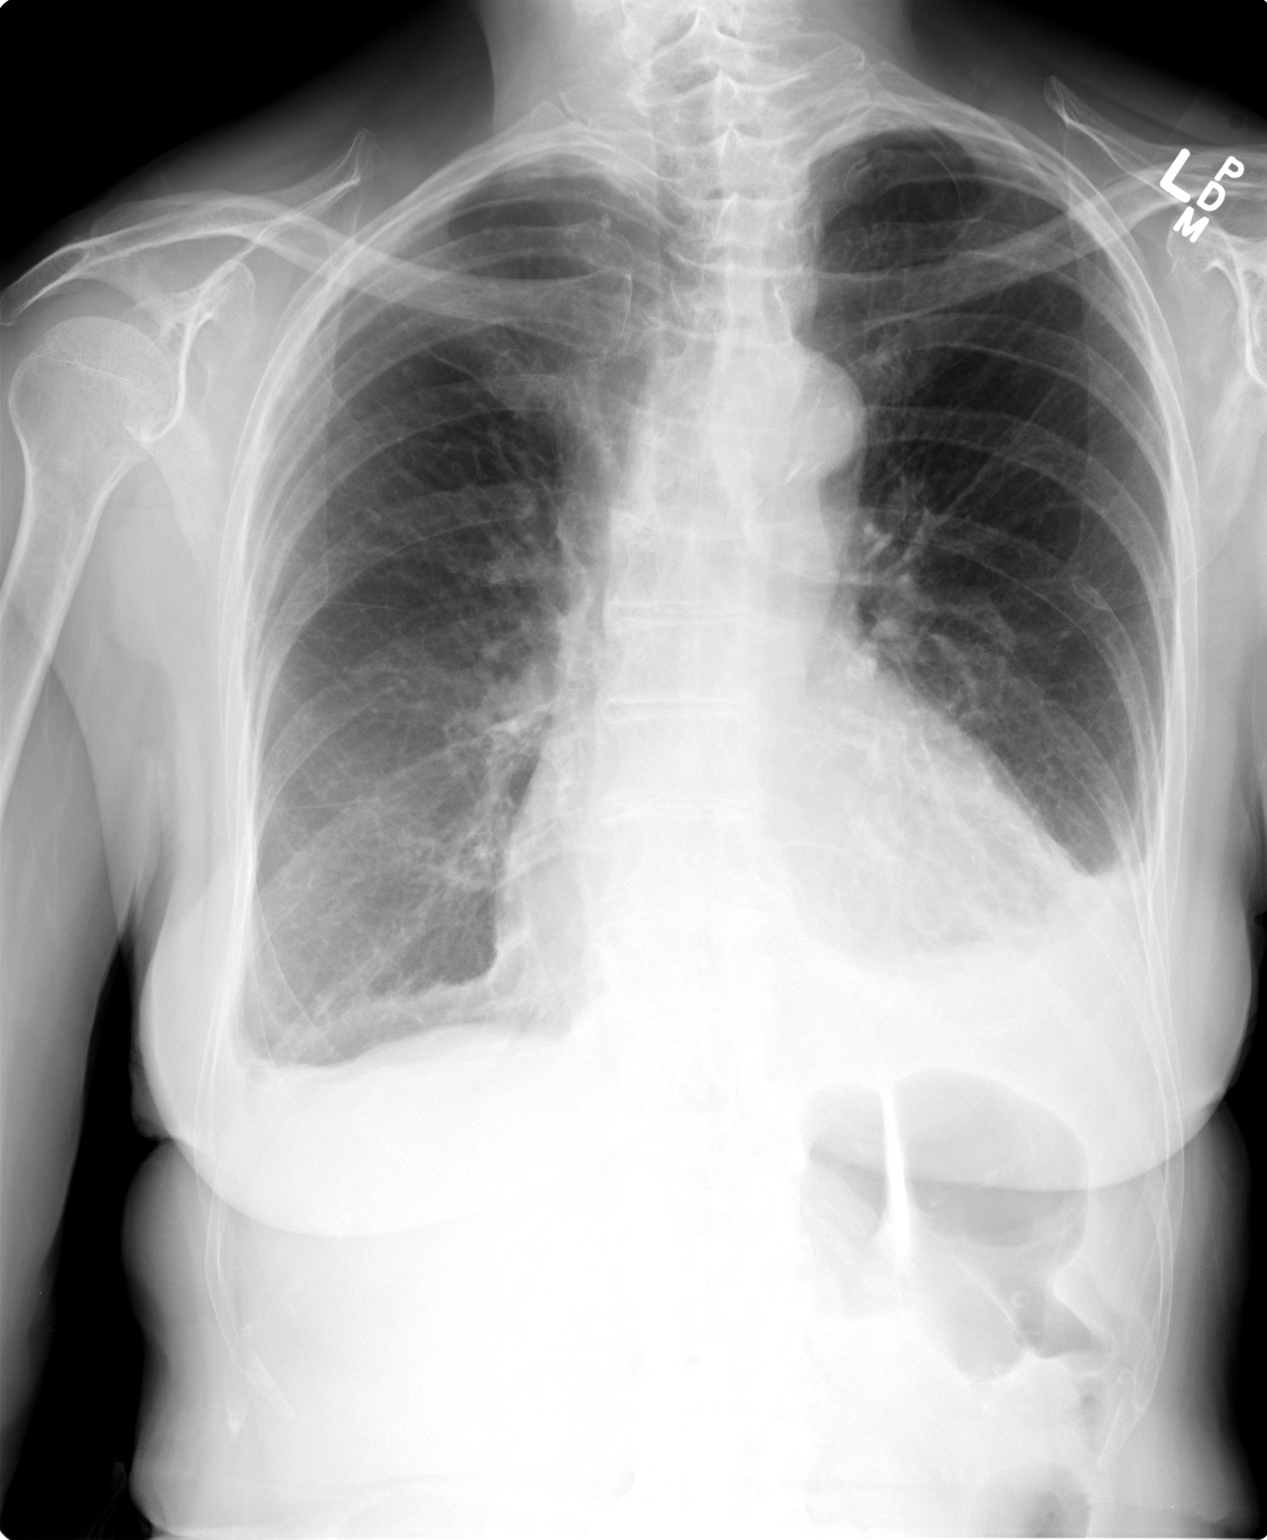

[view not recorded (2 of 2)]
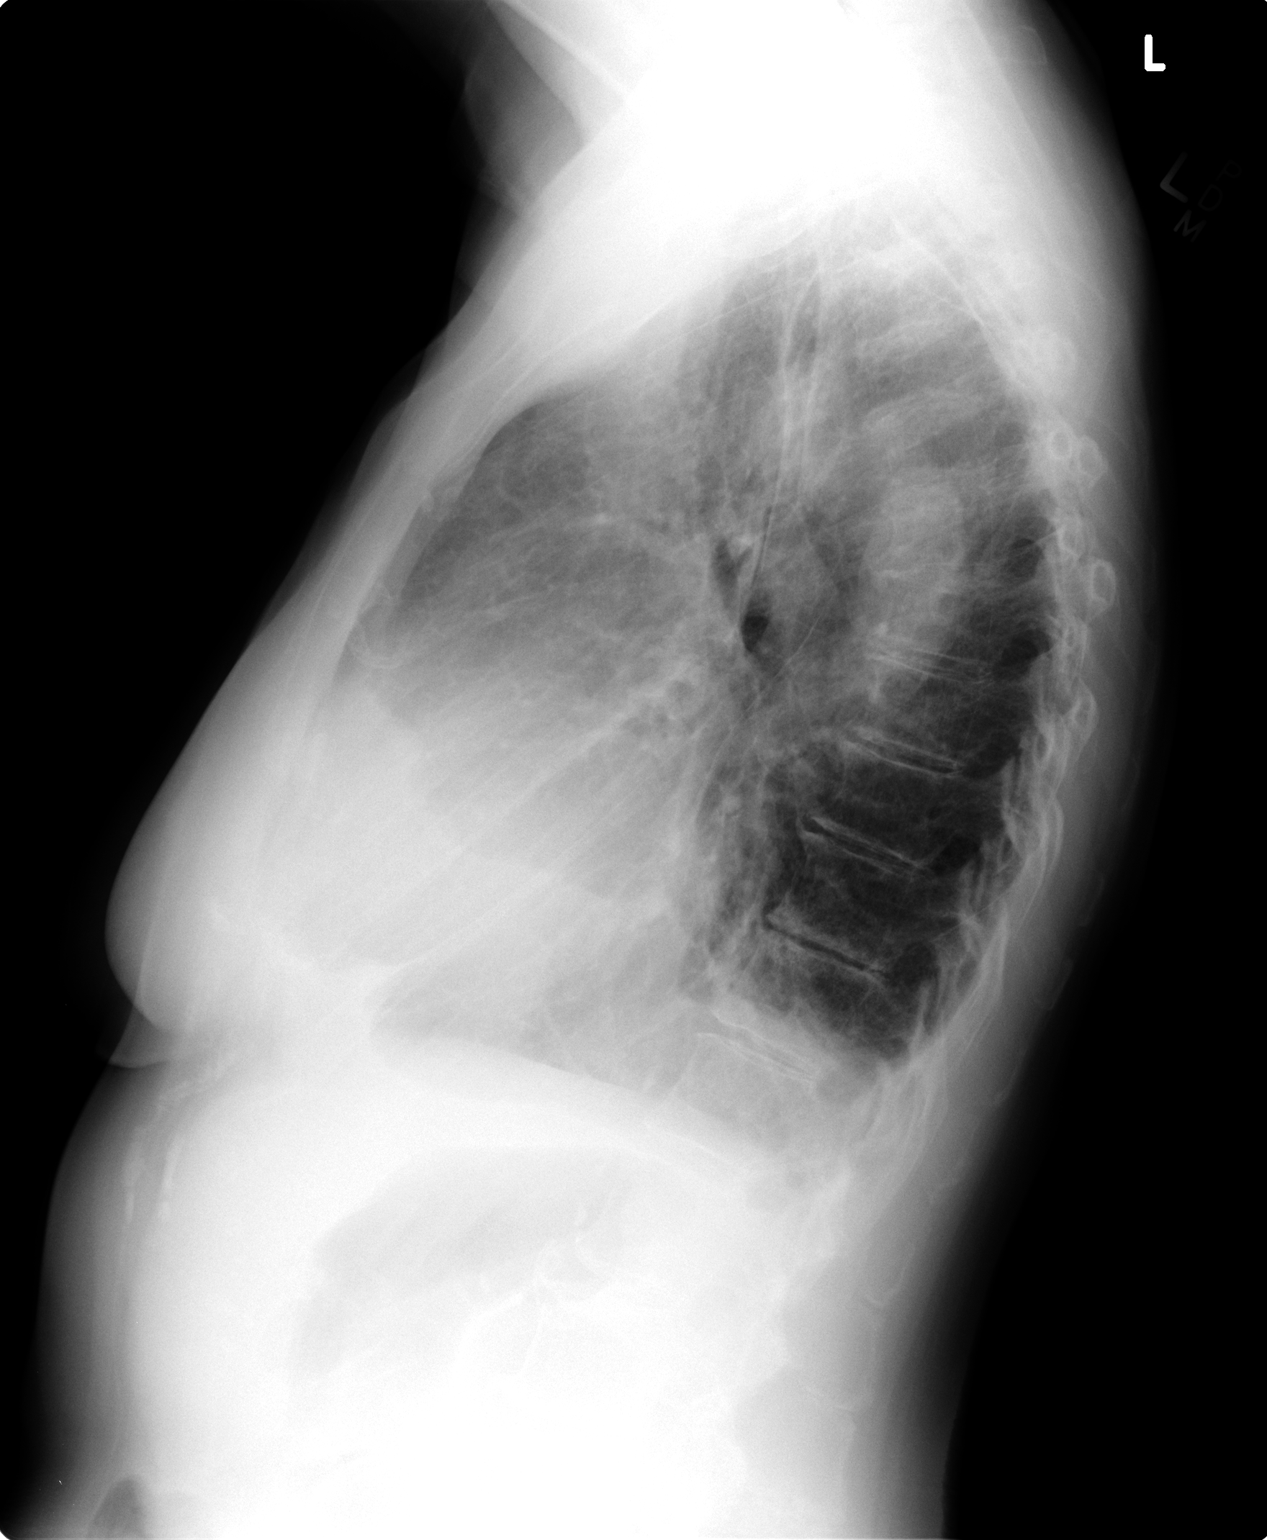

[2 of 2 positions shown; findings below may reference images not displayed]

FINDINGS: Cardiomegaly observed, cardiothoracic index 65%, significantly
increased from prior. Moderate left and small right pleural
effusions observed with passive atelectasis.

Thoracic spondylosis noted.
IMPRESSION: 1. Cardiomegaly, without edema. This cardiomegaly is new compared to
last year's chest radiograph.
2. Moderate left and small right pleural effusions.

## 2015-10-14 IMAGING — CR DG CHEST 2V
2 series · 2 of 2 positions shown · non-contrast
Comparison: 07/13/2013

CLINICAL DATA: Follow-up prior procedure

EXAM:
CHEST  2 VIEW

[view not recorded (1 of 2)]
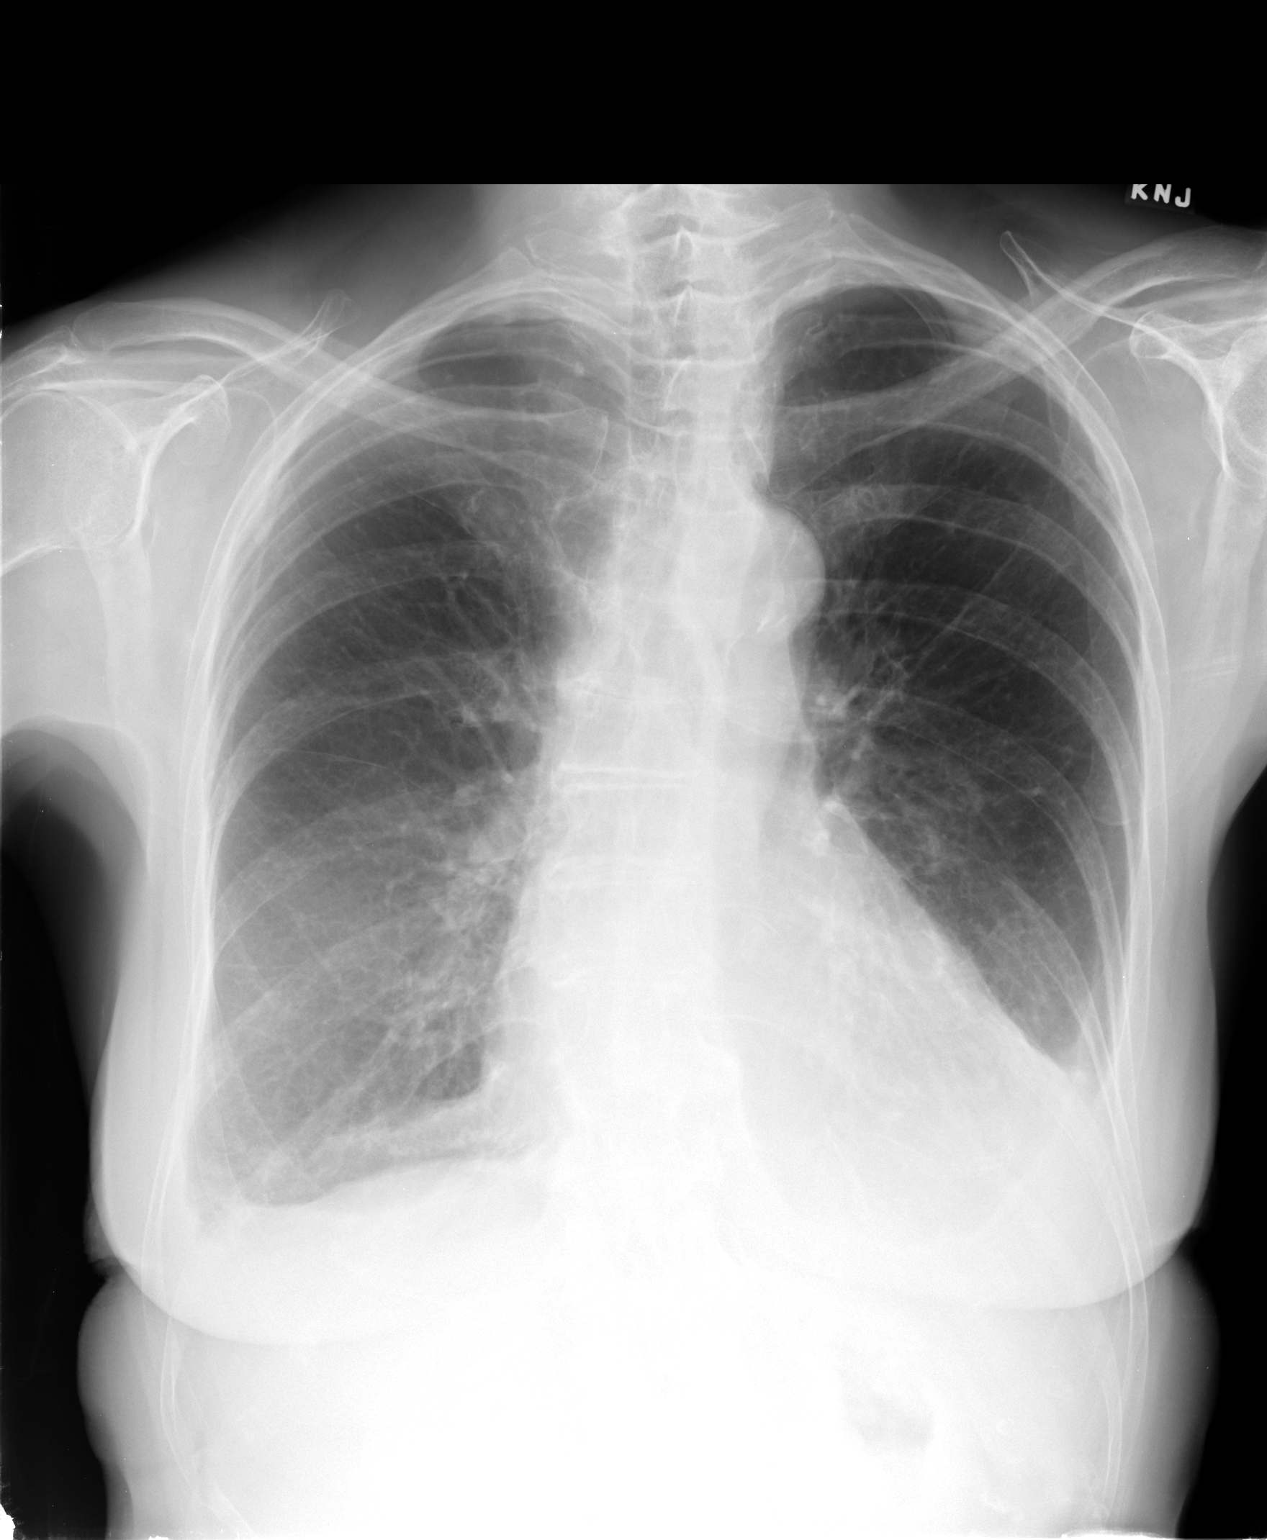

[view not recorded (2 of 2)]
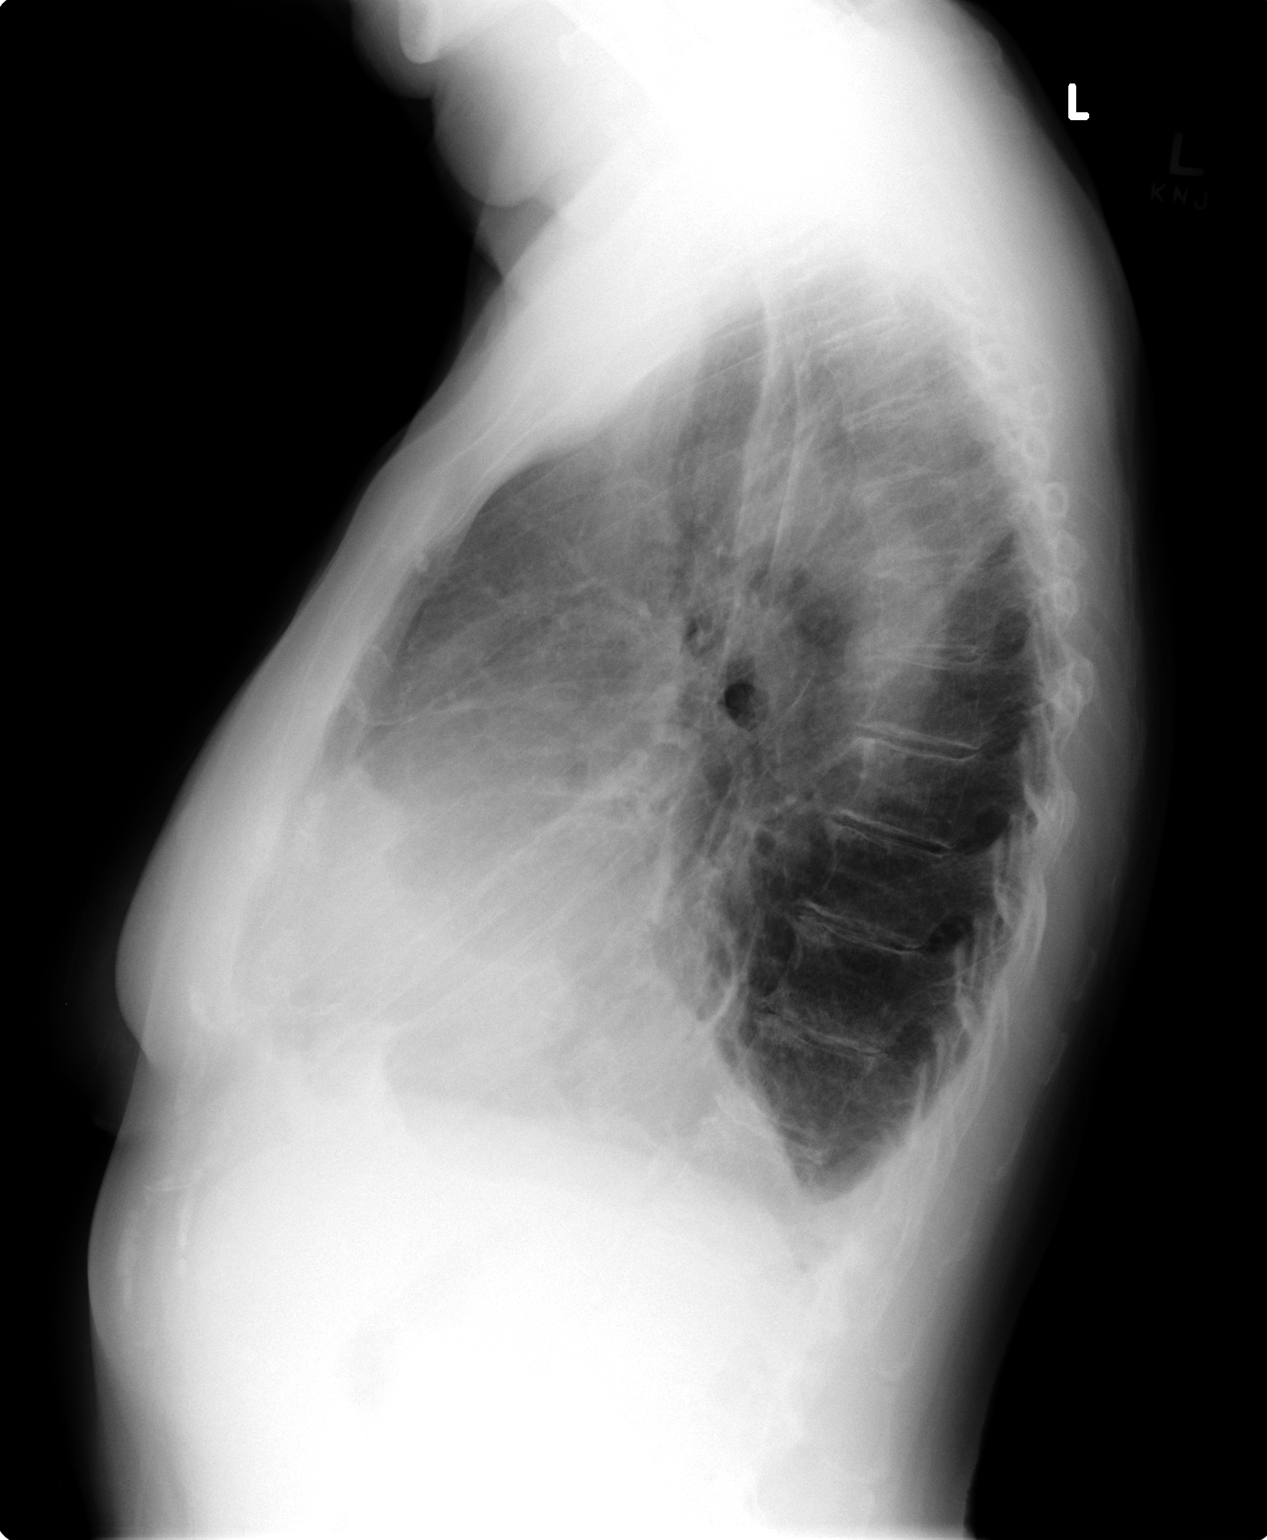

[2 of 2 positions shown; findings below may reference images not displayed]

FINDINGS: Lungs are grossly clear. Small left and trace right pleural
effusions, mildly decreased. No pneumothorax.

The heart is top-normal in size.

Degenerative changes of the visualized thoracolumbar spine.
IMPRESSION: Small left and trace right pleural effusions, mildly increased.

## 2016-03-13 ENCOUNTER — Encounter: Payer: Self-pay | Admitting: Cardiology

## 2016-03-13 ENCOUNTER — Ambulatory Visit (INDEPENDENT_AMBULATORY_CARE_PROVIDER_SITE_OTHER): Payer: Medicare Other | Admitting: Cardiology

## 2016-03-13 VITALS — BP 136/74 | HR 96 | Ht 63.0 in | Wt 132.0 lb

## 2016-03-13 DIAGNOSIS — I483 Typical atrial flutter: Secondary | ICD-10-CM | POA: Diagnosis not present

## 2016-03-13 DIAGNOSIS — I480 Paroxysmal atrial fibrillation: Secondary | ICD-10-CM | POA: Diagnosis not present

## 2016-03-13 DIAGNOSIS — R0989 Other specified symptoms and signs involving the circulatory and respiratory systems: Secondary | ICD-10-CM | POA: Diagnosis not present

## 2016-03-13 DIAGNOSIS — I10 Essential (primary) hypertension: Secondary | ICD-10-CM | POA: Diagnosis not present

## 2016-03-13 DIAGNOSIS — I4892 Unspecified atrial flutter: Secondary | ICD-10-CM

## 2016-03-13 HISTORY — DX: Unspecified atrial flutter: I48.92

## 2016-03-13 LAB — CBC WITH DIFFERENTIAL/PLATELET
BASOS PCT: 1 %
Basophils Absolute: 55 cells/uL (ref 0–200)
EOS ABS: 110 {cells}/uL (ref 15–500)
Eosinophils Relative: 2 %
HCT: 46.7 % — ABNORMAL HIGH (ref 35.0–45.0)
Hemoglobin: 16.1 g/dL — ABNORMAL HIGH (ref 11.7–15.5)
Lymphocytes Relative: 15 %
Lymphs Abs: 825 cells/uL — ABNORMAL LOW (ref 850–3900)
MCH: 32 pg (ref 27.0–33.0)
MCHC: 34.5 g/dL (ref 32.0–36.0)
MCV: 92.8 fL (ref 80.0–100.0)
MONO ABS: 715 {cells}/uL (ref 200–950)
MPV: 11.9 fL (ref 7.5–12.5)
Monocytes Relative: 13 %
NEUTROS ABS: 3795 {cells}/uL (ref 1500–7800)
Neutrophils Relative %: 69 %
PLATELETS: 187 10*3/uL (ref 140–400)
RBC: 5.03 MIL/uL (ref 3.80–5.10)
RDW: 13.2 % (ref 11.0–15.0)
WBC: 5.5 10*3/uL (ref 3.8–10.8)

## 2016-03-13 LAB — BASIC METABOLIC PANEL
BUN: 16 mg/dL (ref 7–25)
CHLORIDE: 104 mmol/L (ref 98–110)
CO2: 25 mmol/L (ref 20–31)
CREATININE: 0.94 mg/dL — AB (ref 0.60–0.88)
Calcium: 9.7 mg/dL (ref 8.6–10.4)
Glucose, Bld: 102 mg/dL — ABNORMAL HIGH (ref 65–99)
POTASSIUM: 4.8 mmol/L (ref 3.5–5.3)
Sodium: 140 mmol/L (ref 135–146)

## 2016-03-13 MED ORDER — FLECAINIDE ACETATE 100 MG PO TABS: 100.0000 mg | ORAL_TABLET | Freq: Two times a day (BID) | ORAL | Status: DC

## 2016-03-13 NOTE — Progress Notes (Signed)
Cardiology Office Note    Date:  03/13/2016   ID:  Lauren BoroughsDorothy M Cannon, DOB 04/27/1931, MRN [id number]  PCP:  Pcp Not In System  Cardiologist:  Armanda Magicraci Turner, MD   Chief Complaint  Patient presents with  . Atrial Fibrillation  . Hypertension  . Atrial Flutter    History of Present Illness:  [name] is a 82 y.o. female with a history of paroxysmal atrial fibrillation s/p afib ablation and chronic systemic anticoagulation who presents today for followup. She is doing well. She denies any chest pain, SOB, DOE, dizziness, palpitations or syncope. She has some puffiness of her ankles at night.   Past Medical History  Diagnosis Date  . Diverticulosis   . Fibrocystic breast disease   . DJD (degenerative joint disease), multiple sites   . Osteopenia   . Macular pucker, left eye   . Sensory hearing loss, bilateral   . Presbycusis   . Colon polyp   . Atrial flutter (HCC)     On chronic anticoagulation with Xarelto for CHADS2VASC score of 4  . PAF (paroxysmal atrial fibrillation) (HCC)     s/p ablation  . Atrial flutter (HCC) 03/13/2016    Past Surgical History  Procedure Laterality Date  . Abdominal hysterectomy      with BSO  . Appendectomy    . Rotator cuff repair    . Eye srugery      macular pucker  . Cataract extraction, bilateral    . Cardioversion  09/18/2012    Procedure: CARDIOVERSION;  Surgeon: Quintella Reichertraci R Turner, MD;  [facility];  Service: Cardiovascular;  Laterality: N/A;  . Cardioversion N/A 01/05/2013    Procedure: CARDIOVERSION;  Surgeon: Quintella Reichertraci R Turner, MD;  Location: [facility] ENDOSCOPY;  Service: Cardiovascular;  Laterality: N/A;  . Tonsillectomy    . Tee without cardioversion N/A 06/24/2013    Procedure: TRANSESOPHAGEAL ECHOCARDIOGRAM (TEE);  Surgeon: Vesta MixerPhilip J Nahser, MD;  [facility];  Service: Cardiovascular;  Laterality: N/A;  . Cardioversion N/A 07/21/2013    Procedure: CARDIOVERSION;  Surgeon: Luis AbedJeffrey D Katz, MD;  [facility];  Service: Cardiovascular;  Laterality: N/A;  . Atrial fibrillation ablation N/A 06/25/2013    Procedure: ATRIAL FIBRILLATION ABLATION;  Surgeon: Gardiner RhymeJames D [name], MD;  [facility];  Service: Cardiovascular;  Laterality: N/A;    Current Medications: Outpatient Prescriptions Prior to Visit  Medication Sig Dispense Refill  . Calcium Carbonate-Vitamin D (CALCIUM-VITAMIN D) 500-200 MG-UNIT per tablet Take 1 tablet by mouth every morning.     . diltiazem (CARDIZEM CD) 240 MG 24 hr capsule Take 1 capsule (240 mg total) by mouth daily. 90 capsule 3  . flecainide (TAMBOCOR) 50 MG tablet Take 1 tablet (50 mg total) by mouth 2 (two) times daily. 180 tablet 3  . Multiple Vitamins-Minerals (ICAPS PO) Take 1 tablet by mouth every morning.     . Rivaroxaban (XARELTO) 15 MG TABS tablet Take 1 tablet (15 mg total) by mouth daily with supper. 90 tablet 3   No facility-administered medications prior to visit.     Allergies:   Penicillins   Social History   Social History  . Marital Status: Widowed    Spouse Name: N/A  . Number of Children: N/A  . Years of Education: N/A   Social History Main Topics  . Smoking status: Never Smoker   . Smokeless tobacco: None  . Alcohol Use: No  . Drug Use: No  . Sexual Activity: Not Asked  Other Topics Concern  . None   Social History Narrative   Lives in [place] alone.  Retired.     Family History:  The patient's family history includes Cancer in her brother and mother; Colon cancer in her father; Heart Problems in her sister.   ROS:   Please see the history of present illness.    ROS All other systems reviewed and are negative.   PHYSICAL EXAM:   VS:  BP 136/74 mmHg  Pulse 96  Ht  (1.6 m)  Wt 132 lb (59.875 kg)  BMI 23.39 kg/m2   GEN: Well nourished, well developed, in no acute distress HEENT: normal Neck: no JVD, carotid bruits, or masses Cardiac: RRR; no murmurs, rubs, or gallops,no edema.  Intact distal pulses  bilaterally.  Respiratory:  clear to auscultation bilaterally, normal work of breathing GI: soft, nontender, nondistended, + BS MS: no deformity or atrophy Skin: warm and dry, no rash Neuro:  Alert and Oriented x 3, Strength and sensation are intact Psych: euthymic mood, full affect  Wt Readings from Last 3 Encounters:  03/13/16 132 lb (59.875 kg)  09/04/15 131 lb 9.6 oz (59.693 kg)  06/09/15 129 lb 9.6 oz (58.786 kg)      Studies/Labs Reviewed:   EKG:  EKG is ordered today and showed atrial flutter with CVR and AT and QTC .  Recent Labs: 06/09/2015: BUN 16; Creatinine, Ser 1.05; Hemoglobin 14.6; Platelets 163.0; Potassium 4.2; Sodium 142; TSH 2.40   Lipid Panel No results found for: CHOL, TRIG, HDL, CHOLHDL, VLDL, LDLCALC, LDLDIRECT  Additional studies/ records that were reviewed today include:  none    ASSESSMENT:    1. PAF (paroxysmal atrial fibrillation) (HCC)   2. Benign essential HTN   3. Typical atrial flutter (HCC)      PLAN:  In order of problems listed above:  1. PAF s/p ablation.  Continue CCB/Flecainide and Xarelto.  Check NOAC. 2.    HTN - Bp controlled on current medical regimen.  Continue CCB. 3.   Atrial Flutter - she is now back in atrial flutter with CVF.  She is asymptomatic at this time.  I will increase her Flecainide to  BID and repeat ETT in 2 weeks.  If she is still in atrial flutter at that time will set her up for DCCV.  Dr. Amedeo Plenty last note indicated if she failed Flecainide then would consider ablation although she is not sure she wants to pursue that.  She will see the PA back in 2 weeks and if still in aflutter will set up for DCCV.   Medication Adjustments/Labs and Tests Ordered: Current medicines are reviewed at length with the patient today.  Concerns regarding medicines are outlined above.  Medication changes, Labs and Tests ordered today are listed in the Patient Instructions below.  There are no Patient  Instructions on file for this visit.   Signed, Armanda Magic, MD  03/13/2016 11:59 AM    [facility] [address] [place], [place], [place]  [address] Phone: [phone]; Fax: [phone]

## 2016-03-13 NOTE — Patient Instructions (Signed)
Medication Instructions:  1) INCREASE FLECAINIDE to 100 mg TWICE DAILY  Labwork: TODAY: BMET, CBC  Testing/Procedures: Your physician has requested that you have an exercise tolerance test IN TWO WEEKS (the same day and before 2 week follow up with PA or NP). For further information please visit [email or web address]. Please also follow instruction sheet, as given.   Your physician has requested that you have a carotid duplex. This test is an ultrasound of the carotid arteries in your neck. It looks at blood flow through these arteries that supply the brain with blood. Allow one hour for this exam. There are no restrictions or special instructions.   Follow-Up: Your physician recommends that you schedule a follow-up appointment in 2 WEEKS with a PA or NP.   Your physician wants you to follow-up in: 6 months with Dr. Mayford Knifeurner. You will receive a reminder letter in the mail two months in advance. If you don't receive a letter, please call our office to schedule the follow-up appointment.   Any Other Special Instructions Will Be Listed Below (If Applicable).     If you need a refill on your cardiac medications before your next appointment, please call your pharmacy.

## 2016-03-20 NOTE — Addendum Note (Signed)
Addended by: Reesa ChewJONES, Mayah Urquidi G on: 03/20/2016 05:11 PM   Modules accepted: Orders

## 2016-03-28 ENCOUNTER — Ambulatory Visit (INDEPENDENT_AMBULATORY_CARE_PROVIDER_SITE_OTHER): Payer: Medicare Other | Admitting: Cardiology

## 2016-03-28 ENCOUNTER — Encounter: Payer: Self-pay | Admitting: Cardiology

## 2016-03-28 ENCOUNTER — Ambulatory Visit (INDEPENDENT_AMBULATORY_CARE_PROVIDER_SITE_OTHER): Payer: Medicare Other

## 2016-03-28 VITALS — BP 140/80 | HR 76 | Ht 63.0 in | Wt 133.1 lb

## 2016-03-28 DIAGNOSIS — I4892 Unspecified atrial flutter: Secondary | ICD-10-CM | POA: Diagnosis not present

## 2016-03-28 DIAGNOSIS — I483 Typical atrial flutter: Secondary | ICD-10-CM

## 2016-03-28 DIAGNOSIS — I480 Paroxysmal atrial fibrillation: Secondary | ICD-10-CM | POA: Diagnosis not present

## 2016-03-28 LAB — EXERCISE TOLERANCE TEST
CHL CUP STRESS STAGE 1 DBP: 78 mmHg
CHL CUP STRESS STAGE 1 GRADE: 0 %
CHL CUP STRESS STAGE 1 SPEED: 0 mph
CHL CUP STRESS STAGE 2 HR: 79 {beats}/min
CHL CUP STRESS STAGE 2 SPEED: 0 mph
CHL CUP STRESS STAGE 3 SPEED: 1 mph
CHL CUP STRESS STAGE 5 SPEED: 1.7 mph
CHL CUP STRESS STAGE 6 GRADE: 12 %
CHL CUP STRESS STAGE 6 HR: 121 {beats}/min
CHL CUP STRESS STAGE 6 SBP: 122 mmHg
CHL CUP STRESS STAGE 6 SPEED: 2.5 mph
CHL CUP STRESS STAGE 7 DBP: 68 mmHg
CHL CUP STRESS STAGE 7 HR: 110 {beats}/min
CHL CUP STRESS STAGE 7 SBP: 129 mmHg
CHL RATE OF PERCEIVED EXERTION: 15
CSEPPMHR: 87 %
Estimated workload: 5.8 METS
Exercise duration (min): 4 min
Exercise duration (sec): 0 s
MPHR: 138 {beats}/min
Peak BP: 122 mmHg
Peak HR: 121 {beats}/min
Percent HR: 88 %
Rest HR: 70 {beats}/min
Stage 1 HR: 76 {beats}/min
Stage 1 SBP: 155 mmHg
Stage 2 Grade: 0 %
Stage 3 Grade: 0 %
Stage 3 HR: 83 {beats}/min
Stage 4 Grade: 0 %
Stage 4 HR: 83 {beats}/min
Stage 4 Speed: 1 mph
Stage 5 Grade: 10 %
Stage 5 HR: 118 {beats}/min
Stage 6 DBP: 69 mmHg
Stage 7 Grade: 0 %
Stage 7 Speed: 0 mph
Stage 8 DBP: 61 mmHg
Stage 8 Grade: 0 %
Stage 8 HR: 76 {beats}/min
Stage 8 SBP: 144 mmHg
Stage 8 Speed: 0 mph

## 2016-03-28 NOTE — Progress Notes (Signed)
03/28/2016 [name]   01/14/1931  [id number]  Primary Physician Pcp Not In System Primary Cardiologist: Dr. Mayford Knifeurner Electrophysiologist: Dr. Johney FrameAllred  Reason for Visit/CC: f/u for Atrial Flutter/ medication monitoring.   HPI:  82 y.o. female with a history of paroxysmal atrial fibrillation/flutter s/p afib ablation and chronic systemic anticoagulation with Xarelto. She is on AAD therapy with Flecainide. She was recently seen by Dr. Mayford Knifeurner 03/13/16. She was noted to be back in atrial flutter with a CVR. Dr. Mendel Cannon elected to incerase her Flecainide to 100 mg BID and ordered a repeat ETT in 2 weeks. Her ETT was today. This was negative for QRS widening and no arrhthymias. Resting EKGs shows NSR. HR is well controlled. She has tolerated the increase in Flecainide ok. No side effects. She has been fully compliant with Xarelto. No abnormal bleeding.    Current Outpatient Prescriptions  Medication Sig Dispense Refill  . Calcium Carbonate-Vitamin D (CALCIUM-VITAMIN D) 500-200 MG-UNIT per tablet Take 1 tablet by mouth every morning.     . diltiazem (CARDIZEM CD) 240 MG 24 hr capsule Take 1 capsule (240 mg total) by mouth daily. 90 capsule 3  . flecainide (TAMBOCOR) 100 MG tablet Take 1 tablet (100 mg total) by mouth 2 (two) times daily. 180 tablet 3  . Multiple Vitamins-Minerals (ICAPS PO) Take 1 tablet by mouth every morning.     . Rivaroxaban (XARELTO) 15 MG TABS tablet Take 1 tablet (15 mg total) by mouth daily with supper. 90 tablet 3   No current facility-administered medications for this visit.    Allergies  Allergen Reactions  . Penicillins Itching and Swelling    Social History   Social History  . Marital Status: Widowed    Spouse Name: N/A  . Number of Children: N/A  . Years of Education: N/A   Occupational History  . Not on file.   Social History Main Topics  . Smoking status: Never Smoker   . Smokeless tobacco: Not on file  . Alcohol Use: No  . Drug Use: No  .  Sexual Activity: Not on file   Other Topics Concern  . Not on file   Social History Narrative   [place] in [place].  Retired.     Review of Systems: General: negative for chills, fever, night sweats or weight changes.  Cardiovascular: negative for chest pain, dyspnea on exertion, edema, orthopnea, palpitations, paroxysmal nocturnal dyspnea or shortness of breath Dermatological: negative for rash Respiratory: negative for cough or wheezing Urologic: negative for hematuria Abdominal: negative for nausea, vomiting, diarrhea, bright red blood per rectum, melena, or hematemesis Neurologic: negative for visual changes, syncope, or dizziness All other systems reviewed and are otherwise negative except as noted above.    Blood pressure 140/80, pulse 76, height 5\' 3"  (1.6 m), weight 133 lb 1.9 oz (60.383 kg).  General appearance: alert, cooperative and no distress Neck: no carotid bruit and no JVD Lungs: clear to auscultation bilaterally Heart: regular rate and rhythm, S1, S2 normal, no murmur, click, rub or gallop Extremities: no LEE Pulses: 2+ and symmetric Skin: warm and dry Neurologic: Grossly normal  EKG NSR. 69 bpm  ASSESSMENT AND PLAN:   1. Paroxsymal Atrial Flutter: successful chemical conversion to NSR with increase in Flecainide to 100 mg BID. ETT is normal. HR is well controlled in the 60s. NSR on EKG. Continue Flecainide + Cardizem + Xarelto.   PLAN  F/u with Dr. Mayford Knifeurner in 6 months.   Robbie LisBrittainy Peniel Hass PA-C 03/28/2016 3:54  PM   

## 2016-03-28 NOTE — Patient Instructions (Signed)
No change in your medications. Continue current medications as prescribed. Keep your follow-up visit with Dr. Mayford Knifeurner in 6 months

## 2016-03-29 ENCOUNTER — Ambulatory Visit (HOSPITAL_COMMUNITY)
Admission: RE | Admit: 2016-03-29 | Discharge: 2016-03-29 | Disposition: A | Discharge status: Home / Self Care | Payer: Medicare Other | Source: Ambulatory Visit | Attending: Cardiology | Admitting: Cardiology

## 2016-03-29 DIAGNOSIS — R0989 Other specified symptoms and signs involving the circulatory and respiratory systems: Secondary | ICD-10-CM

## 2016-08-26 ENCOUNTER — Encounter: Payer: Self-pay | Admitting: Cardiology

## 2016-09-02 ENCOUNTER — Ambulatory Visit (INDEPENDENT_AMBULATORY_CARE_PROVIDER_SITE_OTHER): Payer: Medicare Other | Admitting: Cardiology

## 2016-09-02 ENCOUNTER — Encounter: Payer: Self-pay | Admitting: Cardiology

## 2016-09-02 VITALS — BP 132/70 | HR 79 | Ht 63.0 in | Wt 134.0 lb

## 2016-09-02 DIAGNOSIS — I4819 Other persistent atrial fibrillation: Secondary | ICD-10-CM

## 2016-09-02 DIAGNOSIS — I481 Persistent atrial fibrillation: Secondary | ICD-10-CM

## 2016-09-02 DIAGNOSIS — I4892 Unspecified atrial flutter: Secondary | ICD-10-CM | POA: Diagnosis not present

## 2016-09-02 DIAGNOSIS — I10 Essential (primary) hypertension: Secondary | ICD-10-CM | POA: Diagnosis not present

## 2016-09-02 LAB — BASIC METABOLIC PANEL
BUN: 16 mg/dL (ref 7–25)
CHLORIDE: 104 mmol/L (ref 98–110)
CO2: 29 mmol/L (ref 20–31)
Calcium: 9.7 mg/dL (ref 8.6–10.4)
Creat: 0.85 mg/dL (ref 0.60–0.88)
Glucose, Bld: 84 mg/dL (ref 65–99)
POTASSIUM: 4.6 mmol/L (ref 3.5–5.3)
SODIUM: 142 mmol/L (ref 135–146)

## 2016-09-02 LAB — CBC WITH DIFFERENTIAL/PLATELET
BASOS PCT: 0 %
Basophils Absolute: 0 cells/uL (ref 0–200)
EOS ABS: 130 {cells}/uL (ref 15–500)
Eosinophils Relative: 2 %
HEMATOCRIT: 44.1 % (ref 35.0–45.0)
Hemoglobin: 14.8 g/dL (ref 11.7–15.5)
LYMPHS ABS: 780 {cells}/uL — AB (ref 850–3900)
LYMPHS PCT: 12 %
MCH: 31.6 pg (ref 27.0–33.0)
MCHC: 33.6 g/dL (ref 32.0–36.0)
MCV: 94 fL (ref 80.0–100.0)
MONO ABS: 650 {cells}/uL (ref 200–950)
MPV: 11.8 fL (ref 7.5–12.5)
Monocytes Relative: 10 %
NEUTROS ABS: 4940 {cells}/uL (ref 1500–7800)
Neutrophils Relative %: 76 %
Platelets: 184 10*3/uL (ref 140–400)
RBC: 4.69 MIL/uL (ref 3.80–5.10)
RDW: 13.1 % (ref 11.0–15.0)
WBC: 6.5 10*3/uL (ref 3.8–10.8)

## 2016-09-02 NOTE — Progress Notes (Signed)
Cardiology Office Note    Date:  09/02/2016   ID:  [name], DOB 11/08/1930, MRN [id number]  PCP:  Pcp Not In System  Cardiologist:  Armanda Magicraci Rozina Pointer, MD   Chief Complaint  Patient presents with  . Atrial Fibrillation  . Atrial Flutter  . Hypertension    History of Present Illness:  [name] is a 82 y.o. female with a history of paroxysmal atrial fibrillation s/p afib ablation and chronic systemic anticoagulation who presents today for followup. She is doing well. She denies any chest pain, SOB, DOE, dizziness, palpitations or syncope. She says that this summer she a a free PAD screening and was told she was in atrial fibrillation.  She did not pursue it any further at that time. She has some puffiness of her ankles at night. She says that she is not very aware of any palpitations or irregularity to her heart beat.     Past Medical History:  Diagnosis Date  . Atrial flutter (HCC)    On chronic anticoagulation with Xarelto for CHADS2VASC score of 4  . Colon polyp   . Diverticulosis   . DJD (degenerative joint disease), multiple sites   . Fibrocystic breast disease   . Macular pucker, left eye   . Osteopenia   . Persistent atrial fibrillation (HCC)    s/p ablation  . Presbycusis   . Sensory hearing loss, bilateral     Past Surgical History:  Procedure Laterality Date  . ABDOMINAL HYSTERECTOMY     with BSO  . APPENDECTOMY    . ATRIAL FIBRILLATION ABLATION N/A 06/25/2013   Procedure: ATRIAL FIBRILLATION ABLATION;  Surgeon: Gardiner RhymeJames D [name], MD;  [facility];  Service: Cardiovascular;  Laterality: N/A;  . CARDIOVERSION  09/18/2012   Procedure: CARDIOVERSION;  Surgeon: Quintella Reichertraci R Jamesha Ellsworth, MD;  Location: [facility] ENDOSCOPY;  Service: Cardiovascular;  Laterality: N/A;  . CARDIOVERSION N/A 01/05/2013   Procedure: CARDIOVERSION;  Surgeon: Quintella Reichertraci R Amy Belloso, MD;  Location: [facility] ENDOS[facility];  Service: Cardiovascular;  Laterality: N/A;  . CARDIOVERSION N/A 07/21/2013   Procedure: CARDIOVERSION;  Surgeon: Luis AbedJeffrey D Katz, MD;  [facility];  Service: Cardiovascular;  Laterality: N/A;  . CATARACT EXTRACTION, BILATERAL    . eye srugery     macular pucker  . ROTATOR CUFF REPAIR    . TEE WITHOUT CARDIOVERSION N/A 06/24/2013   Procedure: TRANSESOPHAGEAL ECHOCARDIOGRAM (TEE);  Surgeon: Vesta MixerPhilip J Nahser, MD;  [facility];  Service: Cardiovascular;  Laterality: N/A;  . TONSILLECTOMY      Current Medications: Outpatient Medications Prior to Visit  Medication Sig Dispense Refill  . Calcium Carbonate-Vitamin D (CALCIUM-VITAMIN D) 500-200 MG-UNIT per tablet Take 1 tablet by mouth every morning.     . diltiazem (CARDIZEM CD) 240 MG 24 hr capsule Take 1 capsule (240 mg total) by mouth daily. 90 capsule 3  . flecainide (TAMBOCOR) 100 MG tablet Take 1 tablet (100 mg total) by mouth 2 (two) times daily. 180 tablet 3  . Multiple Vitamins-Minerals (ICAPS PO) Take 1 tablet by mouth every morning.     . Rivaroxaban (XARELTO) 15 MG TABS tablet Take 1 tablet (15 mg total) by mouth daily with supper. 90 tablet 3   No facility-administered medications prior to visit.      Allergies:   Penicillins   Social History   Social History  . Marital status: Widowed    Spouse name: N/A  . Number of children: N/A  . Years of education: N/A  Social History Main Topics  . Smoking status: Never Smoker  . Smokeless tobacco: Never Used  . Alcohol use No  . Drug use: No  . Sexual activity: Not Asked   Other Topics Concern  . None   Social History Narrative   [place]ives in [place] alone.  Retired.     Family History:  The patient's family history includes Cancer in her brother and mother; Colon cancer in her father; Heart Problems in her sister.   ROS:   Please see the history of present illness.    ROS All other systems reviewed and are negative.  No flowsheet data found.     PHYSICAL EXAM:   VS:  BP 132/70   Pulse 79   Ht 5\' 3"  (1.6 m)   Wt 134  lb (60.8 kg)   SpO2 98%   BMI 23.74 kg/m    GEN: Well nourished, well developed, in no acute distress  HEENT: normal  Neck: no JVD, carotid bruits, or masses Cardiac: RRR; no murmurs, rubs, or gallops,no edema.  Intact distal pulses bilaterally.  Respiratory:  clear to auscultation bilaterally, normal work of breathing GI: soft, nontender, nondistended, + BS MS: no deformity or atrophy  Skin: warm and dry, no rash Neuro:  Alert and Oriented x 3, Strength and sensation are intact Psych: euthymic mood, full affect  Wt Readings from Last 3 Encounters:  09/02/16 134 lb (60.8 kg)  03/28/16 133 lb 1.9 oz (60.4 kg)  03/13/16 132 lb (59.9 kg)      Studies/Labs Reviewed:   EKG:  EKG is ordered today.  The ekg ordered today demonstrates atrial flutter with CVR.  Recent Labs: 03/13/2016: BUN 16; Creat 0.94; Hemoglobin 16.1; Platelets 187; Potassium 4.8; Sodium 140   Lipid Panel No results found for: CHOL, TRIG, HDL, CHOLHDL, VLDL, LDLCALC, LDLDIRECT  Additional studies/ records that were reviewed today include:  none    ASSESSMENT:    1. Persistent atrial fibrillation (HCC)   2. Benign essential HTN   3. Atrial flutter, unspecified type (HCC)      PLAN:  In order of problems listed above:  1. Persistent atrial fibrillation s/p afib ablation. She is back in atrial flutter which she was told she was in 6 months ago by another MD but did not seek medical attention.  She is completely asymptomatic and wants to pursue rate control rather than try to get back into NSR.  She will continue Cardizem as well as Xarelto for CHADS2VASC score of 4. We will stop Flecainide.   Check NOAC panel today.   2. HTN - BP controlled on current meds.  3. Paroxysmal atrial flutter - maintaining NSR on Cardizem.     Medication Adjustments/Labs and Tests Ordered: Current medicines are reviewed at length with the patient today.  Concerns regarding medicines are outlined above.  Medication changes,  Labs and Tests ordered today are listed in the Patient Instructions below.  There are no Patient Instructions on file for this visit.   Signed, Armanda Magicraci Phinneas Shakoor, MD  09/02/2016 11:09 AM    [facility] [address], [place], [place]  [address] Phone: [phone](336) [phone]; Fax: [phone](336) [phone]

## 2016-09-02 NOTE — Patient Instructions (Addendum)
Medication Instructions:  1) STOP FLECAINIDE  Labwork: TODAY: BMET, CBC  Testing/Procedures: None  Follow-Up: Your physician wants you to follow-up in: 6 months with Dr. Mayford Knifeurner. You will receive a reminder letter in the mail two months in advance. If you don't receive a letter, please call our office to schedule the follow-up appointment.   Any Other Special Instructions Will Be Listed Below (If Applicable).     If you need a refill on your cardiac medications before your next appointment, please call your pharmacy.

## 2016-09-26 ENCOUNTER — Other Ambulatory Visit: Payer: Self-pay | Admitting: Internal Medicine

## 2016-09-26 DIAGNOSIS — I483 Typical atrial flutter: Secondary | ICD-10-CM

## 2016-10-02 ENCOUNTER — Other Ambulatory Visit: Payer: Self-pay | Admitting: Cardiology

## 2017-03-03 ENCOUNTER — Encounter: Payer: Self-pay | Admitting: Cardiology

## 2017-03-03 ENCOUNTER — Ambulatory Visit (INDEPENDENT_AMBULATORY_CARE_PROVIDER_SITE_OTHER): Payer: Medicare Other | Admitting: Cardiology

## 2017-03-03 VITALS — BP 114/66 | HR 72 | Ht 63.0 in | Wt 131.0 lb

## 2017-03-03 DIAGNOSIS — I481 Persistent atrial fibrillation: Secondary | ICD-10-CM | POA: Diagnosis not present

## 2017-03-03 DIAGNOSIS — I10 Essential (primary) hypertension: Secondary | ICD-10-CM | POA: Diagnosis not present

## 2017-03-03 DIAGNOSIS — I4892 Unspecified atrial flutter: Secondary | ICD-10-CM | POA: Diagnosis not present

## 2017-03-03 DIAGNOSIS — I4819 Other persistent atrial fibrillation: Secondary | ICD-10-CM

## 2017-03-03 MED ORDER — DILTIAZEM HCL ER COATED BEADS 180 MG PO CP24: 180.0000 mg | ORAL_CAPSULE | Freq: Every day | ORAL | 3 refills | Status: DC

## 2017-03-03 MED ORDER — DILTIAZEM HCL ER COATED BEADS 180 MG PO CP24: 180.0000 mg | ORAL_CAPSULE | Freq: Every day | ORAL | 0 refills | Status: DC

## 2017-03-03 NOTE — Progress Notes (Signed)
Cardiology Office Note    Date:  03/03/2017   ID:  [name], DOB [birth year], MRN [id number]  PCP:  System, Pcp Not In  Cardiologist:  Armanda Magic, MD   Chief Complaint  Patient presents with  . Atrial Fibrillation  . Atrial Flutter  . Hypertension    History of Present Illness:  [name] is a 83 y.o. female  with a history of persistent atrial fibrillation s/p afib ablation and is on chronic systemic anticoagulation. She is here today for followup and is doing well. She denies any chest pain or pressure, SOB, DOE, PND, orthopnea, dizziness, LE edema, palpitations or syncope.She has had some problems with peeling of her skin on the Xarelto and was concerned about it.  She is compliant with her meds and has not had any bleeding problems.    Past Medical History:  Diagnosis Date  . Atrial flutter (HCC)    On chronic anticoagulation with Xarelto for CHADS2VASC score of 4  . Colon polyp   . Diverticulosis   . DJD (degenerative joint disease), multiple sites   . Fibrocystic breast disease   . Macular pucker, left eye   . Osteopenia   . Persistent atrial fibrillation (HCC)    s/p ablation  . Presbycusis   . Sensory hearing loss, bilateral     Past Surgical History:  Procedure Laterality Date  . ABDOMINAL HYSTERECTOMY     with BSO  . APPENDECTOMY    . ATRIAL FIBRILLATION ABLATION N/A 06/25/2013   Procedure: ATRIAL FIBRILLATION ABLATION;  Surgeon: Gardiner Rhyme, MD;  [facility];  Service: Cardiovascular;  Laterality: N/A;  . CARDIOVERSION  09/18/2012   Procedure: CARDIOVERSION;  Surgeon: Quintella Reichert, MD;  [facility]: [facility] ENDOS[facility];  Service: Cardiovascular;  Laterality: N/A;  . CARDIOVERSION N/A 01/05/2013   Procedure: CARDIOVERSION;  Surgeon: Quintella Reichert, MD;  Location: [facility] ENDOS[facility];  Service: Cardiovascular;  Laterality: N/A;  . CARDIOVERSION N/A 07/21/2013   Procedure: CARDIOVERSION;  Surgeon: Luis Abed, MD;  [facility] ENDOSCOPY;   Service: Cardiovascular;  Laterality: N/A;  . CATARACT EXTRACTION, BILATERAL    . eye srugery     macular pucker  . ROTATOR CUFF REPAIR    . TEE WITHOUT CARDIOVERSION N/A 06/24/2013   Procedure: TRANSESOPHAGEAL ECHOCARDIOGRAM (TEE);  Surgeon: Vesta Mixer, MD;  Location: [facility]OSCOPY;  Service: Cardiovascular;  Laterality: N/A;  . TONSILLECTOMY      Current Medications: No outpatient prescriptions have been marked as taking for the 03/03/17 encounter (Office Visit) with Quintella Reichert, MD.    Allergies:   Penicillins   Social History   Social History  . Marital status: Widowed    Spouse name: N/A  . Number of children: N/A  . Years of education: N/A   Social History Main Topics  . Smoking status: Never Smoker  . Smokeless tobacco: Never Used  . Alcohol use No  . Drug use: No  . Sexual activity: Not Asked   Other Topics Concern  . None   Social History Narrative   Lives in [place] alone.  Retired.     Family History:  The patient's family history includes Cancer in her brother and mother; Colon cancer in her father; Heart Problems in her sister.   ROS:   Please see the history of present illness.    Review of Systems  Musculoskeletal: Positive for back pain.   All other systems reviewed and are negative.  No flowsheet data found.  PHYSICAL EXAM:   VS:  BP 114/66   Pulse 72   Ht 5\' 3"  (1.6 m)   Wt 131 lb (59.4 kg)   SpO2 98%   BMI 23.21 kg/m    GEN: Well nourished, well developed, in no acute distress  HEENT: normal  Neck: no JVD, carotid bruits, or masses Cardiac: irregularly irregular ; no murmurs, rubs, or gallops,no edema.  Intact distal pulses bilaterally.  Respiratory:  clear to auscultation bilaterally, normal work of breathing GI: soft, nontender, nondistended, + BS MS: no deformity or atrophy  Skin: warm and dry, no rash Neuro:  Alert and Oriented x 3, Strength and sensation are intact Psych: euthymic mood, full affect  Wt Readings  from Last 3 Encounters:  03/03/17 131 lb (59.4 kg)  09/02/16 134 lb (60.8 kg)  03/28/16 133 lb 1.9 oz (60.4 kg)      Studies/Labs Reviewed:   EKG:  EKG is ordered today and snowed atrial flutter with CVR at 77bpm.    Recent Labs: 09/02/2016: BUN 16; Creat 0.85; Hemoglobin 14.8; Platelets 184; Potassium 4.6; Sodium 142   Lipid Panel No results found for: CHOL, TRIG, HDL, CHOLHDL, VLDL, LDLCALC, LDLDIRECT  Additional studies/ records that were reviewed today include:  none    ASSESSMENT:    1. Persistent atrial fibrillation (HCC)   2. Atrial flutter, unspecified type (HCC)   3. Benign essential HTN      PLAN:  In order of problems listed above:  1. Persistent atrial fibrillation s/p ablation.  She has a CHADS2VASC score of 4 and is on Xarelto. She will continue on Xarelto and CCB.  I will check a BMET and CBC.  2.   Chronic atrial flutter - she did not want to pursue rhythm controlled and is now rate controlled.  She will continue CCB and NOAC. She is having some problems with intermittent low SBP in the mid 90's.  I will decrease her Cardizem CD to 180mg  daily and get a 48 hour Holter monitor to assess for adequate HR control on lower dose.  2. HTN - her BP is adequately controlled on exam today but she says that it has been dropping as low as 95mmHg.  She will continue on Cardizem CD but decrease the dose to 180mg  daily.    Medication Adjustments/Labs and Tests Ordered: Current medicines are reviewed at length with the patient today.  Concerns regarding medicines are outlined above.  Medication changes, Labs and Tests ordered today are listed in the Patient Instructions below.  There are no Patient Instructions on file for this visit.   Signed, Armanda Magicraci Sirenia Whitis, MD  03/03/2017 2:43 PM    [facility] [address], [place], [place]  [address] Phone: [phone](336) [phone]; Fax: [phone](336) [phone]

## 2017-03-03 NOTE — Patient Instructions (Signed)
Medication Instructions:  1) DECREASE CARDIZEM to 180 mg daily  Labwork: TODAY: BMET, CBC  Testing/Procedures: Your physician has recommended that you wear a holter monitor. Holter monitors are medical devices that record the heart's electrical activity. Doctors most often use these monitors to diagnose arrhythmias. Arrhythmias are problems with the speed or rhythm of the heartbeat. The monitor is a small, portable device. You can wear one while you do your normal daily activities. This is usually used to diagnose what is causing palpitations/syncope (passing out).  Follow-Up: Your physician wants you to follow-up in: 6 months with Dr. Mayford Knifeurner. You will receive a reminder letter in the mail two months in advance. If you don't receive a letter, please call our office to schedule the follow-up appointment.   Any Other Special Instructions Will Be Listed Below (If Applicable).     If you need a refill on your cardiac medications before your next appointment, please call your pharmacy.

## 2017-03-04 LAB — CBC WITH DIFFERENTIAL/PLATELET
Basophils Absolute: 0 10*3/uL (ref 0.0–0.2)
Basos: 1 %
EOS (ABSOLUTE): 0.2 10*3/uL (ref 0.0–0.4)
Eos: 3 %
HEMOGLOBIN: 14 g/dL (ref 11.1–15.9)
Hematocrit: 41.9 % (ref 34.0–46.6)
IMMATURE GRANS (ABS): 0 10*3/uL (ref 0.0–0.1)
IMMATURE GRANULOCYTES: 0 %
LYMPHS: 17 %
Lymphocytes Absolute: 0.8 10*3/uL (ref 0.7–3.1)
MCH: 30.6 pg (ref 26.6–33.0)
MCHC: 33.4 g/dL (ref 31.5–35.7)
MCV: 92 fL (ref 79–97)
MONOCYTES: 15 %
Monocytes Absolute: 0.7 10*3/uL (ref 0.1–0.9)
NEUTROS ABS: 3.1 10*3/uL (ref 1.4–7.0)
NEUTROS PCT: 64 %
Platelets: 155 10*3/uL (ref 150–379)
RBC: 4.57 x10E6/uL (ref 3.77–5.28)
RDW: 13.5 % (ref 12.3–15.4)
WBC: 4.8 10*3/uL (ref 3.4–10.8)

## 2017-03-04 LAB — BASIC METABOLIC PANEL
BUN/Creatinine Ratio: 17 (ref 12–28)
BUN: 17 mg/dL (ref 8–27)
CALCIUM: 9.1 mg/dL (ref 8.7–10.3)
CHLORIDE: 104 mmol/L (ref 96–106)
CO2: 22 mmol/L (ref 18–29)
Creatinine, Ser: 1 mg/dL (ref 0.57–1.00)
GFR, EST AFRICAN AMERICAN: 60 mL/min/{1.73_m2} (ref >59)
GFR, EST NON AFRICAN AMERICAN: 52 mL/min/{1.73_m2} — AB (ref >59)
Glucose: 92 mg/dL (ref 65–99)
Potassium: 3.9 mmol/L (ref 3.5–5.2)
Sodium: 143 mmol/L (ref 134–144)

## 2017-03-05 NOTE — Addendum Note (Signed)
Addended by: Micki RileySHOFFNER, Magdalina Whitehead C on: 03/05/2017 09:44 AM   Modules accepted: Orders

## 2017-03-06 ENCOUNTER — Ambulatory Visit (INDEPENDENT_AMBULATORY_CARE_PROVIDER_SITE_OTHER): Payer: Medicare Other

## 2017-03-06 DIAGNOSIS — I4892 Unspecified atrial flutter: Secondary | ICD-10-CM

## 2017-03-06 DIAGNOSIS — I481 Persistent atrial fibrillation: Secondary | ICD-10-CM | POA: Diagnosis not present

## 2017-03-06 DIAGNOSIS — I4819 Other persistent atrial fibrillation: Secondary | ICD-10-CM

## 2017-03-12 ENCOUNTER — Telehealth: Payer: Self-pay | Admitting: Nurse Practitioner

## 2017-03-12 DIAGNOSIS — I4819 Other persistent atrial fibrillation: Secondary | ICD-10-CM

## 2017-03-12 NOTE — Telephone Encounter (Signed)
-----   Message from Quintella Reichertraci R Turner, MD sent at 03/12/2017  2:55 PM EDT ----- Please repeat echo to assess LVF

## 2017-03-12 NOTE — Telephone Encounter (Signed)
Reviewed holter results with patient who verbalized understanding. She is in agreement to have echo and is aware someone from our office will call her to schedule.

## 2017-03-24 ENCOUNTER — Ambulatory Visit (HOSPITAL_COMMUNITY): Payer: Medicare Other | Attending: Internal Medicine

## 2017-03-24 ENCOUNTER — Other Ambulatory Visit: Payer: Self-pay

## 2017-03-24 DIAGNOSIS — I371 Nonrheumatic pulmonary valve insufficiency: Secondary | ICD-10-CM | POA: Insufficient documentation

## 2017-03-24 DIAGNOSIS — I10 Essential (primary) hypertension: Secondary | ICD-10-CM | POA: Insufficient documentation

## 2017-03-24 DIAGNOSIS — I4819 Other persistent atrial fibrillation: Secondary | ICD-10-CM

## 2017-03-24 DIAGNOSIS — R002 Palpitations: Secondary | ICD-10-CM | POA: Insufficient documentation

## 2017-03-24 DIAGNOSIS — I4892 Unspecified atrial flutter: Secondary | ICD-10-CM | POA: Diagnosis not present

## 2017-03-24 DIAGNOSIS — I481 Persistent atrial fibrillation: Secondary | ICD-10-CM | POA: Diagnosis present

## 2017-03-24 DIAGNOSIS — I083 Combined rheumatic disorders of mitral, aortic and tricuspid valves: Secondary | ICD-10-CM | POA: Diagnosis not present

## 2017-03-30 ENCOUNTER — Other Ambulatory Visit: Payer: Self-pay | Admitting: Cardiology

## 2017-09-24 DIAGNOSIS — N6019 Diffuse cystic mastopathy of unspecified breast: Secondary | ICD-10-CM | POA: Insufficient documentation

## 2017-09-24 DIAGNOSIS — H35372 Puckering of macula, left eye: Secondary | ICD-10-CM | POA: Insufficient documentation

## 2017-09-24 DIAGNOSIS — M8580 Other specified disorders of bone density and structure, unspecified site: Secondary | ICD-10-CM | POA: Insufficient documentation

## 2017-09-24 DIAGNOSIS — H903 Sensorineural hearing loss, bilateral: Secondary | ICD-10-CM | POA: Insufficient documentation

## 2017-09-24 DIAGNOSIS — K5790 Diverticulosis of intestine, part unspecified, without perforation or abscess without bleeding: Secondary | ICD-10-CM | POA: Insufficient documentation

## 2017-09-24 DIAGNOSIS — K635 Polyp of colon: Secondary | ICD-10-CM | POA: Insufficient documentation

## 2017-09-24 DIAGNOSIS — M159 Polyosteoarthritis, unspecified: Secondary | ICD-10-CM | POA: Insufficient documentation

## 2017-09-24 DIAGNOSIS — H9110 Presbycusis, unspecified ear: Secondary | ICD-10-CM | POA: Insufficient documentation

## 2017-10-10 ENCOUNTER — Other Ambulatory Visit: Payer: Self-pay | Admitting: *Deleted

## 2017-10-10 DIAGNOSIS — I483 Typical atrial flutter: Secondary | ICD-10-CM

## 2017-10-10 MED ORDER — RIVAROXABAN 15 MG PO TABS
15.0000 mg | ORAL_TABLET | Freq: Every day | ORAL | 1 refills | Status: DC
Start: 2017-10-10 — End: 2018-04-05

## 2017-10-12 NOTE — Progress Notes (Signed)
Cardiology Office Note:    Date:  10/13/2017   ID:  Lauren BoroughsDorothy M Cannon, DOB 04/22/1931, MRN [id number]  PCP:  Vernona Riegerlark, Katherine, MD  Cardiologist:  No primary care provider on file.    Referring MD: [place] PinkAuffinger, Susan, MD   Chief Complaint  Patient presents with  . Atrial Fibrillation  . Atrial Flutter    History of Present Illness:    [name] is a 84 y.o. female with a hx of with a history of persistent atrial fibrillation s/p afib ablation and now chronic atrial flutter with decision to pursue rate controlled going forward.  She is on chronic systemic anticoagulation.  She is here today for followup and is doing well.  SHe denies any chest pain or pressure, SOB, DOE, PND, orthopnea, dizziness, palpitations or syncope. Occasionally she will have some mild LE edea by the end of the day which is gone by the am. She is compliant with her meds and is tolerating meds with no SE.  She is currently having a problem with her Rx for Xarelto.    Past Medical History:  Diagnosis Date  . Atrial flutter (HCC)    On chronic anticoagulation with Xarelto for CHADS2VASC score of 4  . Colon polyp   . Diverticulosis   . DJD (degenerative joint disease), multiple sites   . Fibrocystic breast disease   . Macular pucker, left eye   . Osteopenia   . Persistent atrial fibrillation (HCC)    s/p ablation  . Presbycusis   . Sensory hearing loss, bilateral     Past Surgical History:  Procedure Laterality Date  . ABDOMINAL HYSTERECTOMY     with BSO  . APPENDECTOMY    . ATRIAL FIBRILLATION ABLATION N/A 06/25/2013   Procedure: ATRIAL FIBRILLATION ABLATION;  Surgeon: Gardiner RhymeJames D [name], MD;  [facility]: [facility] CATH [facility];  Service: Cardiovascular;  Laterality: N/A;  . CARDIOVERSION  09/18/2012   Procedure: CARDIOVERSION;  Surgeon: Quintella Reichertraci R Turner, MD;  Location: [facility] ENDOSCOPY;  Service: Cardiovascular;  Laterality: N/A;  . CARDIOVERSION N/A 01/05/2013   Procedure: CARDIOVERSION;  Surgeon: Quintella Reichertraci R Turner, MD;   Location: [facility] ENDOSCOPY;  Service: Cardiovascular;  Laterality: N/A;  . CARDIOVERSION N/A 07/21/2013   Procedure: CARDIOVERSION;  Surgeon: Luis AbedJeffrey D Katz, MD;  [facility];  Service: Cardiovascular;  Laterality: N/A;  . CATARACT EXTRACTION, BILATERAL    . eye srugery     macular pucker  . ROTATOR CUFF REPAIR    . TEE WITHOUT CARDIOVERSION N/A 06/24/2013   Procedure: TRANSESOPHAGEAL ECHOCARDIOGRAM (TEE);  Surgeon: Vesta MixerPhilip J Nahser, MD;  [facility];  Service: Cardiovascular;  Laterality: N/A;  . TONSILLECTOMY      Current Medications: No outpatient medications have been marked as taking for the 10/13/17 encounter (Office Visit) with Quintella Reicherturner, Traci R, MD.     Allergies:   Penicillins   Social History   Socioeconomic History  . Marital status: Widowed    Spouse name: None  . Number of children: None  . Years of education: None  . Highest education level: None  Social Needs  . Financial resource strain: None  . Food insecurity - worry: None  . Food insecurity - inability: None  . Transportation needs - medical: None  . Transportation needs - non-medical: None  Occupational History  . None  Tobacco Use  . Smoking status: Never Smoker  . Smokeless tobacco: Never Used  Substance and Sexual Activity  . Alcohol use: No  . Drug use: No  .  Sexual activity: None  Other Topics Concern  . None  Social History Narrative   Lives in [place] alone.  Retired.     Family History: The patient's family history includes Cancer in her brother and mother; Colon cancer in her father; Heart Problems in her sister.  ROS:   Please see the history of present illness.    ROS  All other systems reviewed and negative.   EKGs/Labs/Other Studies Reviewed:    The following studies were reviewed today: none  EKG:  EKG is not ordered today.    Recent Labs: 03/03/2017: BUN 17; Creatinine, Ser 1.00; Hemoglobin 14.0; Platelets 155; Potassium 3.9; Sodium 143   Recent Lipid  Panel No results found for: CHOL, TRIG, HDL, CHOLHDL, VLDL, LDLCALC, LDLDIRECT  Physical Exam:    VS:  BP (!) 145/85   Pulse 61   Ht 5\' 3"  (1.6 m)   Wt 132 lb (59.9 kg)   BMI 23.38 kg/m     Wt Readings from Last 3 Encounters:  10/13/17 132 lb (59.9 kg)  03/03/17 131 lb (59.4 kg)  09/02/16 134 lb (60.8 kg)     GEN:  Well nourished, well developed in no acute distress HEENT: Normal NECK: No JVD; No carotid bruits LYMPHATICS: No lymphadenopathy CARDIAC: irregularly irregular, no murmurs, rubs, gallops RESPIRATORY:  Clear to auscultation without rales, wheezing or rhonchi  ABDOMEN: Soft, non-tender, non-distended MUSCULOSKELETAL:  No edema; No deformity  SKIN: Warm and dry NEUROLOGIC:  Alert and oriented x 3 PSYCHIATRIC:  Normal affect   ASSESSMENT:    1. Persistent atrial fibrillation (HCC)   2. Benign essential HTN    PLAN:    In order of problems listed above:  1.  Persistent atrial fibrillation s/p afib ablation and chronic atrial flutter - Her HR is well controlled on exam today.  She will continue on Cardizem CD 180mg  daily and Xarelto 15mg  daily.  I wlil repeat a BMET and CBC since she is on NOAC.  2.  HTN - Her BP is controlled on exam today.  She will continue on Cardizem.    Medication Adjustments/Labs and Tests Ordered: Current medicines are reviewed at length with the patient today.  Concerns regarding medicines are outlined above.  No orders of the defined types were placed in this encounter.  No orders of the defined types were placed in this encounter.   Signed, Armanda Magic, MD  10/13/2017 11:23 AM    [facility]

## 2017-10-13 ENCOUNTER — Ambulatory Visit (INDEPENDENT_AMBULATORY_CARE_PROVIDER_SITE_OTHER): Payer: Medicare Other | Admitting: Cardiology

## 2017-10-13 ENCOUNTER — Encounter: Payer: Self-pay | Admitting: Cardiology

## 2017-10-13 VITALS — BP 145/85 | HR 61 | Ht 63.0 in | Wt 132.0 lb

## 2017-10-13 DIAGNOSIS — I10 Essential (primary) hypertension: Secondary | ICD-10-CM | POA: Diagnosis not present

## 2017-10-13 DIAGNOSIS — I481 Persistent atrial fibrillation: Secondary | ICD-10-CM

## 2017-10-13 DIAGNOSIS — I4819 Other persistent atrial fibrillation: Secondary | ICD-10-CM

## 2017-10-13 LAB — CBC
Hematocrit: 43.9 % (ref 34.0–46.6)
Hemoglobin: 15.2 g/dL (ref 11.1–15.9)
MCH: 31.5 pg (ref 26.6–33.0)
MCHC: 34.6 g/dL (ref 31.5–35.7)
MCV: 91 fL (ref 79–97)
PLATELETS: 177 10*3/uL (ref 150–379)
RBC: 4.82 x10E6/uL (ref 3.77–5.28)
RDW: 13.3 % (ref 12.3–15.4)
WBC: 5.6 10*3/uL (ref 3.4–10.8)

## 2017-10-13 LAB — BASIC METABOLIC PANEL
BUN/Creatinine Ratio: 18 (ref 12–28)
BUN: 17 mg/dL (ref 8–27)
CALCIUM: 10 mg/dL (ref 8.7–10.3)
CO2: 25 mmol/L (ref 20–29)
Chloride: 102 mmol/L (ref 96–106)
Creatinine, Ser: 0.92 mg/dL (ref 0.57–1.00)
GFR calc Af Amer: 66 mL/min/{1.73_m2} (ref >59)
GFR calc non Af Amer: 57 mL/min/{1.73_m2} — ABNORMAL LOW (ref >59)
GLUCOSE: 97 mg/dL (ref 65–99)
Potassium: 4.2 mmol/L (ref 3.5–5.2)
SODIUM: 141 mmol/L (ref 134–144)

## 2017-10-13 NOTE — Patient Instructions (Signed)
Medication Instructions:  Your physician recommends that you continue on your current medications as directed. Please refer to the Current Medication list given to you today.  Labwork: Today for kidney function and complete blood count  Testing/Procedures: None ordered   Follow-Up: Your physician wants you to follow-up in: 6 months with Dr. Turner. You will receive a reminder letter in the mail two months in advance. If you don't receive a letter, please call our office to schedule the follow-up appointment.  Any Other Special Instructions Will Be Listed Below (If Applicable).     If you need a refill on your cardiac medications before your next appointment, please call your pharmacy.   

## 2018-04-05 ENCOUNTER — Other Ambulatory Visit: Payer: Self-pay | Admitting: Cardiology

## 2018-04-05 DIAGNOSIS — I483 Typical atrial flutter: Secondary | ICD-10-CM

## 2018-04-06 NOTE — Telephone Encounter (Signed)
Xarelto 15mg  refill request received; pt is 84 years old, wt-59.9kg, Crea-0.92 on 10/13/17, last seen by Dr. Mayford Knifeurner on 10/13/17, CrCl-43.4204ml/min; will send in refill to requested pharmacy.

## 2018-04-13 ENCOUNTER — Ambulatory Visit (INDEPENDENT_AMBULATORY_CARE_PROVIDER_SITE_OTHER): Payer: Medicare Other | Admitting: Cardiology

## 2018-04-13 ENCOUNTER — Encounter: Payer: Self-pay | Admitting: Cardiology

## 2018-04-13 VITALS — BP 113/69 | HR 84 | Ht 63.0 in | Wt 132.4 lb

## 2018-04-13 DIAGNOSIS — I4892 Unspecified atrial flutter: Secondary | ICD-10-CM

## 2018-04-13 DIAGNOSIS — I10 Essential (primary) hypertension: Secondary | ICD-10-CM

## 2018-04-13 NOTE — Patient Instructions (Signed)
Medication Instructions:   Your physician recommends that you continue on your current medications as directed. Please refer to the Current Medication list given to you today.  If you need a refill on your cardiac medications before your next appointment, please call your pharmacy.  Labwork:  NONE ORDERED  TODAY    Testing/Procedures: NONE ORDERED  TODAY    Follow-Up:  Your physician wants you to follow-up in:  IN  6  MONTHS WITH DR  TURNER ASSISTANT  You will receive a reminder letter in the mail two months in advance. If you don't receive a letter, please call our office to schedule the follow-up appointment.  Your physician wants you to follow-up in: ONE YEAR WITH  TURNER You will receive a reminder letter in the mail two months in advance. If you don't receive a letter, please call our office to schedule the follow-up appointment.     Any Other Special Instructions Will Be Listed Below (If Applicable).

## 2018-04-13 NOTE — Progress Notes (Signed)
Cardiology Office Note:    Date:  04/13/2018   ID:  Lauren BoroughsDorothy M Cannon, DOB 06/25/1931, MRN [id number]  PCP:  Vernona Riegerlark, Katherine, MD  Cardiologist:  No primary care provider on file.    Referring MD: Vernona Riegerlark, Katherine, MD   Chief Complaint  Patient presents with  . Atrial Fibrillation    History of Present Illness:    [name] is a 84 y.o. female with a hx of persistentatrial fibrillation s/p afib ablationand now chronic atrial flutter with decision to pursue rate controlled going forward.  She is onchronic systemic anticoagulation.  She is here today for followup and is doing well.  She denies any chest pain or pressure, SOB, DOE, PND, orthopnea, LE edema, dizziness, palpitations or syncope. She is compliant with her meds and is tolerating meds with no SE.    Past Medical History:  Diagnosis Date  . Atrial flutter (HCC)    On chronic anticoagulation with Xarelto for CHADS2VASC score of 4  . Colon polyp   . Diverticulosis   . DJD (degenerative joint disease), multiple sites   . Fibrocystic breast disease   . Macular pucker, left eye   . Osteopenia   . Persistent atrial fibrillation (HCC)    s/p ablation  . Presbycusis   . Sensory hearing loss, bilateral     Past Surgical History:  Procedure Laterality Date  . ABDOMINAL HYSTERECTOMY     with BSO  . APPENDECTOMY    . ATRIAL FIBRILLATION ABLATION N/A 06/25/2013   Procedure: ATRIAL FIBRILLATION ABLATION;  Surgeon: Gardiner RhymeJames D [name], MD;  [facility]: [facility];  Service: Cardiovascular;  Laterality: N/A;  . CARDIOVERSION  09/18/2012   Procedure: CARDIOVERSION;  Surgeon: Quintella Reichertraci R Catha Ontko, MD;  Location: [facility] ENDOSCOPY;  Service: Cardiovascular;  Laterality: N/A;  . CARDIOVERSION N/A 01/05/2013   Procedure: CARDIOVERSION;  Surgeon: Quintella Reichertraci R Yolunda Kloos, MD;  Location: [facility] ENDOSCOPY;  Service: Cardiovascular;  Laterality: N/A;  . CARDIOVERSION N/A 07/21/2013   Procedure: CARDIOVERSION;  Surgeon: Luis AbedJeffrey D Katz, MD;  [facility];  Service: Cardiovascular;  Laterality: N/A;  . CATARACT EXTRACTION, BILATERAL    . eye srugery     macular pucker  . ROTATOR CUFF REPAIR    . TEE WITHOUT CARDIOVERSION N/A 06/24/2013   Procedure: TRANSESOPHAGEAL ECHOCARDIOGRAM (TEE);  Surgeon: Vesta MixerPhilip J Nahser, MD;  [facility];  Service: Cardiovascular;  Laterality: N/A;  . TONSILLECTOMY      Current Medications: Current Meds  Medication Sig  . diltiazem (CARDIZEM CD) 180 MG 24 hr capsule TAKE 1 CAPSULE BY MOUTH EVERY DAY  . Multiple Vitamins-Minerals (ICAPS PO) Take 1 tablet by mouth every morning.   [name]. XARELTO 15 MG TABS tablet TAKE 1 TABLET DAILY WITH   SUPPER     Allergies:   Penicillins   Social History   Socioeconomic History  . Marital status: Widowed    Spouse name: Not on file  . Number of children: Not on file  . Years of education: Not on file  . Highest education level: Not on file  Occupational History  . Not on file  Social Needs  . Financial resource strain: Not on file  . Food insecurity:    Worry: Not on file    Inability: Not on file  . Transportation needs:    Medical: Not on file    Non-medical: Not on file  Tobacco Use  . Smoking status: Never Smoker  . Smokeless tobacco: Never Used  Substance and Sexual Activity  .  Alcohol use: No  . Drug use: No  . Sexual activity: Not on file  Lifestyle  . Physical activity:    Days per week: Not on file    Minutes per session: Not on file  . Stress: Not on file  Relationships  . Social connections:    Talks on phone: Not on file    Gets together: Not on file    Attends religious service: Not on file    Active member of club or organization: Not on file    Attends meetings of clubs or organizations: Not on file    Relationship status: Not on file  Other Topics Concern  . Not on file  Social History Narrative   Lives in [place] alone.  Retired.     Family History: The patient's family history includes Cancer in her brother  and mother; Colon cancer in her father; Heart Problems in her sister.  ROS:   Please see the history of present illness.    ROS  All other systems reviewed and negative.   EKGs/Labs/Other Studies Reviewed:    The following studies were reviewed today: none  EKG:  EKG is ordered today and showed atrial fibrillation with controlled ventricular response at 84 bpm with occasional PVC and nonspecific ST-T wave of normality.```````  Recent Labs: 10/13/2017: BUN 17; Creatinine, Ser 0.92; Hemoglobin 15.2; Platelets 177; Potassium 4.2; Sodium 141   Recent Lipid Panel No results found for: CHOL, TRIG, HDL, CHOLHDL, VLDL, LDLCALC, LDLDIRECT  Physical Exam:    VS:  BP 113/69 (BP Location: Left Arm, Patient Position: Sitting, Cuff Size: Normal)   Pulse 84   Ht 5\' 3"  (1.6 m)   Wt 132 lb 6.4 oz (60.1 kg)   BMI 23.45 kg/m     Wt Readings from Last 3 Encounters:  04/13/18 132 lb 6.4 oz (60.1 kg)  10/13/17 132 lb (59.9 kg)  03/03/17 131 lb (59.4 kg)     GEN:  Well nourished, well developed in no acute distress HEENT: Normal NECK: No JVD; No carotid bruits LYMPHATICS: No lymphadenopathy CARDIAC: iregularly irregular, no murmurs, rubs, gallops RESPIRATORY:  Clear to auscultation without rales, wheezing or rhonchi  ABDOMEN: Soft, non-tender, non-distended MUSCULOSKELETAL:  No edema; No deformity  SKIN: Warm and dry NEUROLOGIC:  Alert and oriented x 3 PSYCHIATRIC:  Normal affect   ASSESSMENT:    1. Atrial flutter, unspecified type (HCC)   2. Benign essential HTN    PLAN:    In order of problems listed above:  1.  Paroxysmal atrial fibrillation status post A. fib ablation and now in and out of chronic atrial fibrillation and chronic atrial flutter well rate controlled.  She will continue on Cardizem 180 mg daily and Xarelto 15 mg daily.  Last creatinine was 0.92 on 10/13/2017 and hemoglobin was 15.2 on 10/13/2017.  Xarelto is dosed for age greater than 80 as well as weight at 60 kg or  less.  2.  Hypertension -BP is well controlled on exam today.  She will continue on Cardizem CD 180 mg daily.  Medication Adjustments/Labs and Tests Ordered: Current medicines are reviewed at length with the patient today.  Concerns regarding medicines are outlined above.  Orders Placed This Encounter  Procedures  . EKG 12-Lead   No orders of the defined types were placed in this encounter.   Signed, Armanda Magic, MD  04/13/2018 2:59 PM    [facility]

## 2018-06-11 ENCOUNTER — Other Ambulatory Visit: Payer: Self-pay | Admitting: Cardiology

## 2018-09-18 ENCOUNTER — Other Ambulatory Visit: Payer: Self-pay | Admitting: Cardiology

## 2018-09-18 DIAGNOSIS — I483 Typical atrial flutter: Secondary | ICD-10-CM

## 2018-11-09 ENCOUNTER — Encounter: Payer: Self-pay | Admitting: Physician Assistant

## 2018-11-09 NOTE — Progress Notes (Signed)
Cardiology Office Note    Date:  11/10/2018  ID:  [name], DOB [birth year], MRN [id number] PCP:  Vernona Rieger, MD  Cardiologist:  Armanda Magic, MD   Chief Complaint: 6 month f/u afib/flutter  History of Present Illness:  [name] is a 85 y.o. female with history of persistent atrial fibrillation s/p afib ablation, atrial flutter, chronic systemic anticoagulation with Xarelto, diverticulosis, PVCs, DJD, hearing loss who presents for routine follow-up. She was previously on flecainide but more recently has just been on a rate control strategy for chronic atrial flutter and fibrillation. Toprol was previously changed to diltiazem due to hair loss. ETT 2017 was negative for ischemia. Carotid duplex 2017 was normal. Event monitor in 2018 showed afib with average HR 75, ranging 38->153bpm, occasional PVCs, nonsustained wide complex tachycardia up to 7 beats in a row likely secondary to aberration, longest pause 2.4sec. 2d echo 02/2017 showed EF 55-60%, mild AI, moderate RAE, PASP . Last labs 09/2017 showed K 4.2, Cr 0.92, normal CBC, 06/2015 TSH wnl.   She returns for routine follow-up and has had an unremarkable 6 months. No CP, SOB, palpitations, falls, dizziness, bleeding or fainting. Tolerating all meds without adverse effect.  Past Medical History:  Diagnosis Date  . Atrial flutter (HCC)    On chronic anticoagulation with Xarelto for CHADS2VASC score of 4  . Colon polyp   . Diverticulosis   . DJD (degenerative joint disease), multiple sites   . Fibrocystic breast disease   . Macular pucker, left eye   . Osteopenia   . Persistent atrial fibrillation    s/p ablation  . Presbycusis   . PVC's (premature ventricular contractions)   . Sensory hearing loss, bilateral     Past Surgical History:  Procedure Laterality Date  . ABDOMINAL HYSTERECTOMY     with BSO  . APPENDECTOMY    . ATRIAL FIBRILLATION ABLATION N/A 06/25/2013   Procedure: ATRIAL FIBRILLATION  ABLATION;  Surgeon: Gardiner Rhyme, MD;  Location: [facility] CATH LAB;  Service: Cardiovascular;  Laterality: N/A;  . CARDIOVERSION  09/18/2012   Procedure: CARDIOVERSION;  Surgeon: Quintella Reichert, MD;  Location: [facility] ENDOSCOPY;  Service: Cardiovascular;  Laterality: N/A;  . CARDIOVERSION N/A 01/05/2013   Procedure: CARDIOVERSION;  Surgeon: Quintella Reichert, MD;  Location: [facility] ENDOSCOPY;  Service: Cardiovascular;  Laterality: N/A;  . CARDIOVERSION N/A 07/21/2013   Procedure: CARDIOVERSION;  Surgeon: Luis Abed, MD;  Location: [facility] ENDOSCOPY;  Service: Cardiovascular;  Laterality: N/A;  . CATARACT EXTRACTION, BILATERAL    . eye srugery     macular pucker  . ROTATOR CUFF REPAIR    . TEE WITHOUT CARDIOVERSION N/A 06/24/2013   Procedure: TRANSESOPHAGEAL ECHOCARDIOGRAM (TEE);  Surgeon: Vesta Mixer, MD;  Location: [facility] ENDOSCOPY;  Service: Cardiovascular;  Laterality: N/A;  . TONSILLECTOMY      Current Medications: Current Meds  Medication Sig  . diltiazem (CARDIZEM CD) 180 MG 24 hr capsule TAKE 1 CAPSULE BY MOUTH EVERY DAY  . Multiple Vitamins-Minerals (ICAPS PO) Take 1 tablet by mouth every morning.   [name] 15 MG TABS tablet TAKE 1 TABLET DAILY WITH   SUPPER      Allergies:   Penicillins and Toprol xl [metoprolol tartrate]   Social History   Socioeconomic History  . Marital status: Widowed    Spouse name: Not on file  . Number of children: Not on file  . Years of education: Not on file  . Highest education level: Not on file  Occupational History  . Not on file  Social Needs  . Financial resource strain: Not on file  . Food insecurity:    Worry: Not on file    Inability: Not on file  . Transportation needs:    Medical: Not on file    Non-medical: Not on file  Tobacco Use  . Smoking status: Never Smoker  . Smokeless tobacco: Never Used  Substance and Sexual Activity  . Alcohol use: No  . Drug use: No  . Sexual activity: Not on file  Lifestyle  . Physical activity:    Days  per week: Not on file    Minutes per session: Not on file  . Stress: Not on file  Relationships  . Social connections:    Talks on phone: Not on file    Gets together: Not on file    Attends religious service: Not on file    Active member of club or organization: Not on file    Attends meetings of clubs or organizations: Not on file    Relationship status: Not on file  Other Topics Concern  . Not on file  Social History Narrative   [place]  Retired.     Family History:  The patient's family history includes Cancer in her brother and mother; Colon cancer in her father; Heart Problems in her sister.  ROS:   Please see the history of present illness.  All other systems are reviewed and otherwise negative.    PHYSICAL EXAM:   VS:  BP 126/68   Pulse (!) 56   Ht 5\' 3"  (1.6 m)   Wt 132 lb 12.8 oz (60.2 kg)   SpO2 97%   BMI 23.52 kg/m   BMI: Body mass index is 23.52 kg/m. GEN: Well nourished, well developed WF, in no acute distress HEENT: normocephalic, atraumatic Neck: no JVD, carotid bruits, or masses Cardiac: irregularly irregular, rate controlled, no murmurs, rubs, or gallops, no edema  Respiratory:  clear to auscultation bilaterally, normal work of breathing GI: soft, nontender, nondistended, + BS MS: no deformity or atrophy Skin: warm and dry, no rash Neuro:  Alert and Oriented x 3, Strength and sensation are intact, follows commands Psych: euthymic mood, full affect  Wt Readings from Last 3 Encounters:  11/10/18 132 lb 12.8 oz (60.2 kg)  04/13/18 132 lb 6.4 oz (60.1 kg)  10/13/17 132 lb (59.9 kg)      Studies/Labs Reviewed:   EKG:  EKG was ordered today and personally reviewed by me and demonstrates atrial fib 65bpm, one PVC, nonspecific STT changes  Recent Labs: No results found for requested labs within last 8760 hours.   Lipid Panel No results found for: CHOL, TRIG, HDL, CHOLHDL, VLDL, LDLCALC, LDLDIRECT  Additional studies/ records  that were reviewed today include: Summarized above.   ASSESSMENT & PLAN:   1. Permanent atrial fibrillation - chart indicates chronic atrial flutter but last EKG tracing and today's both show chronic atrial fib. The difference is not clinically significant at this point as she is completely asymptomatic and doing well on rate control and anticoagulation. Will update CBC/BMET for our labs today to ensure dose of Xarelto is appropriate. 2. Atrial flutter - rate control strategy employed, asymptomatic. 3. Chronic anticoagulation - discussed bleeding precautions which patient has not experienced. 4. Occasional PVCs - asymptomatic. Continue current regimen and surveillance for symptoms.  Disposition: F/u with Dr. Mayford Knifeurner in 6 months.  Medication Adjustments/Labs and Tests Ordered: Current medicines are reviewed at  length with the patient today.  Concerns regarding medicines are outlined above. Medication changes, Labs and Tests ordered today are summarized above and listed in the Patient Instructions accessible in Encounters.   Signed, Laurann Montana, PA-C  11/10/2018 4:04 PM    [facility] [address] [place], [place], [place]  [address] Phone: [phone]; Fax: [phone]

## 2018-11-10 ENCOUNTER — Encounter: Payer: Self-pay | Admitting: Physician Assistant

## 2018-11-10 ENCOUNTER — Ambulatory Visit (INDEPENDENT_AMBULATORY_CARE_PROVIDER_SITE_OTHER): Payer: Medicare Other | Admitting: Physician Assistant

## 2018-11-10 VITALS — BP 126/68 | HR 56 | Ht 63.0 in | Wt 132.8 lb

## 2018-11-10 DIAGNOSIS — I4821 Permanent atrial fibrillation: Secondary | ICD-10-CM | POA: Diagnosis not present

## 2018-11-10 DIAGNOSIS — I493 Ventricular premature depolarization: Secondary | ICD-10-CM

## 2018-11-10 DIAGNOSIS — Z7901 Long term (current) use of anticoagulants: Secondary | ICD-10-CM | POA: Diagnosis not present

## 2018-11-10 DIAGNOSIS — I4892 Unspecified atrial flutter: Secondary | ICD-10-CM | POA: Diagnosis not present

## 2018-11-10 NOTE — Patient Instructions (Signed)
Medication Instructions:  Your physician recommends that you continue on your current medications as directed. Please refer to the Current Medication list given to you today.  If you need a refill on your cardiac medications before your next appointment, please call your pharmacy.   Lab work: TODAY:  BMET & CBC  If you have labs (blood work) drawn today and your tests are completely normal, you will receive your results only by: Marland Kitchen MyChart Message (if you have MyChart) OR . A paper copy in the mail If you have any lab test that is abnormal or we need to change your treatment, we will call you to review the results.  Testing/Procedures: None ordered  Follow-Up: At [facility], you and your health needs are our priority.  As part of our continuing mission to provide you with exceptional heart care, we have created designated Provider Care Teams.  These Care Teams include your primary Cardiologist (physician) and Advanced Practice Providers (APPs -  Physician Assistants and Nurse Practitioners) who all work together to provide you with the care you need, when you need it. You will need a follow up appointment in 6 months.  Please call our office 2 months in advance to schedule this appointment.  You may see Armanda Magic, MD or one of the following Advanced Practice Providers on your designated Care Team:   [place], PA-C Ronie Spies, PA-C . Jacolyn Reedy, PA-C  Any Other Special Instructions Will Be Listed Below (If Applicable).

## 2018-11-11 LAB — BASIC METABOLIC PANEL
BUN/Creatinine Ratio: 13 (ref 12–28)
BUN: 14 mg/dL (ref 8–27)
CO2: 22 mmol/L (ref 20–29)
Calcium: 9.7 mg/dL (ref 8.7–10.3)
Chloride: 104 mmol/L (ref 96–106)
Creatinine, Ser: 1.09 mg/dL — ABNORMAL HIGH (ref 0.57–1.00)
GFR, EST AFRICAN AMERICAN: 53 mL/min/{1.73_m2} — AB (ref >59)
GFR, EST NON AFRICAN AMERICAN: 46 mL/min/{1.73_m2} — AB (ref >59)
Glucose: 95 mg/dL (ref 65–99)
Potassium: 4.3 mmol/L (ref 3.5–5.2)
Sodium: 144 mmol/L (ref 134–144)

## 2018-11-11 LAB — CBC
Hematocrit: 42.8 % (ref 34.0–46.6)
Hemoglobin: 14.5 g/dL (ref 11.1–15.9)
MCH: 31.4 pg (ref 26.6–33.0)
MCHC: 33.9 g/dL (ref 31.5–35.7)
MCV: 93 fL (ref 79–97)
Platelets: 158 10*3/uL (ref 150–450)
RBC: 4.62 x10E6/uL (ref 3.77–5.28)
RDW: 12 % (ref 11.7–15.4)
WBC: 4.9 10*3/uL (ref 3.4–10.8)

## 2019-03-10 ENCOUNTER — Other Ambulatory Visit: Payer: Self-pay | Admitting: Cardiology

## 2019-03-10 DIAGNOSIS — I483 Typical atrial flutter: Secondary | ICD-10-CM

## 2019-03-10 NOTE — Telephone Encounter (Signed)
85yo, 132lbs, Scr 1.09 on 11/10/18, Crcl 18ml/min Last OV 11/10/18 Indication afib

## 2019-06-01 ENCOUNTER — Ambulatory Visit: Payer: Medicare Other | Admitting: Cardiology

## 2019-06-09 ENCOUNTER — Other Ambulatory Visit: Payer: Self-pay | Admitting: Cardiology

## 2019-07-05 ENCOUNTER — Telehealth: Payer: Medicare Other | Admitting: Cardiology

## 2019-07-05 ENCOUNTER — Encounter

## 2019-07-06 ENCOUNTER — Telehealth: Payer: Self-pay

## 2019-07-06 NOTE — Telephone Encounter (Signed)
Called pt to set up OV with TT 07/07/2019, left message for pt to call the office.

## 2019-07-07 NOTE — Telephone Encounter (Signed)
Follow Up   Patient states that she will no longer be scheduling appointments with Dr Radford Pax. She has found a cardiologist that is in [place] which is where she lives.

## 2023-10-01 DEATH — deceased
